# Patient Record
Sex: Male | Born: 1970
Health system: Southern US, Community
[De-identification: ages and names within clinical notes are randomized; demographics above are authoritative.]

## PROBLEM LIST (undated history)

## (undated) DIAGNOSIS — F419 Anxiety disorder, unspecified: Secondary | ICD-10-CM

## (undated) DIAGNOSIS — F32A Depression, unspecified: Secondary | ICD-10-CM

## (undated) DIAGNOSIS — J449 Chronic obstructive pulmonary disease, unspecified: Secondary | ICD-10-CM

## (undated) DIAGNOSIS — F329 Major depressive disorder, single episode, unspecified: Secondary | ICD-10-CM

## (undated) DIAGNOSIS — I10 Essential (primary) hypertension: Secondary | ICD-10-CM

## (undated) DIAGNOSIS — M1990 Unspecified osteoarthritis, unspecified site: Secondary | ICD-10-CM

## (undated) DIAGNOSIS — E7800 Pure hypercholesterolemia, unspecified: Secondary | ICD-10-CM

## (undated) HISTORY — DX: Unspecified osteoarthritis, unspecified site: M19.90

## (undated) HISTORY — PX: LITHOTRIPSY: SUR834

## (undated) HISTORY — DX: Chronic obstructive pulmonary disease, unspecified: J44.9

## (undated) HISTORY — PX: BACK SURGERY: SHX140

---

## 2014-02-06 ENCOUNTER — Emergency Department (HOSPITAL_COMMUNITY)
Admission: EM | Admit: 2014-02-06 | Discharge: 2014-02-06 | Disposition: A | Discharge status: Home / Self Care | Payer: Medicare Other | Attending: Emergency Medicine | Admitting: Emergency Medicine

## 2014-02-06 ENCOUNTER — Encounter (HOSPITAL_COMMUNITY): Payer: Self-pay | Admitting: *Deleted

## 2014-02-06 DIAGNOSIS — Z720 Tobacco use: Secondary | ICD-10-CM | POA: Insufficient documentation

## 2014-02-06 DIAGNOSIS — M549 Dorsalgia, unspecified: Secondary | ICD-10-CM | POA: Diagnosis present

## 2014-02-06 DIAGNOSIS — M5441 Lumbago with sciatica, right side: Secondary | ICD-10-CM | POA: Insufficient documentation

## 2014-02-06 MED ORDER — DIAZEPAM 5 MG/ML IJ SOLN: 5.0000 mg | Freq: Once | INTRAMUSCULAR | Status: DC

## 2014-02-06 MED ORDER — HYDROCODONE-ACETAMINOPHEN 5-325 MG PO TABS
1.0000 | ORAL_TABLET | Freq: Once | ORAL | Status: AC
  Administered 2014-02-06: 1 via ORAL
  Filled 2014-02-06: qty 1

## 2014-02-06 MED ORDER — KETOROLAC TROMETHAMINE 60 MG/2ML IM SOLN
30.0000 mg | Freq: Once | INTRAMUSCULAR | Status: AC
  Administered 2014-02-06: 30 mg via INTRAMUSCULAR
  Filled 2014-02-06: qty 2

## 2014-02-06 MED ORDER — DIAZEPAM 5 MG PO TABS: 5.0000 mg | ORAL_TABLET | Freq: Once | ORAL | Status: DC

## 2014-02-06 MED ORDER — IBUPROFEN 800 MG PO TABS: 800.0000 mg | ORAL_TABLET | Freq: Three times a day (TID) | ORAL | Status: AC

## 2014-02-06 MED ORDER — DIAZEPAM 5 MG PO TABS
5.0000 mg | ORAL_TABLET | Freq: Once | ORAL | Status: AC
  Administered 2014-02-06: 5 mg via ORAL
  Filled 2014-02-06: qty 1

## 2014-02-06 MED ORDER — HYDROCODONE-ACETAMINOPHEN 5-325 MG PO TABS: 1.0000 | ORAL_TABLET | Freq: Four times a day (QID) | ORAL | Status: DC | PRN

## 2014-02-06 NOTE — ED Notes (Signed)
Pt has past hx of back surgery and injury. Pt had cabinets fall on him 2 days ago and states he has had increase pain since then. Pt states his pain moves from his lower back to his upper back. NAD noted at this time.

## 2014-02-06 NOTE — ED Provider Notes (Signed)
CSN: [id number]     Arrival date & time 02/06/14  1401 History   This chart was scribe for Carmin Muskrat, MD by Judithann Sauger, ED Scribe. The patient was seen in room APA09/APA09 and the patient's care was started at 6:56 PM.    Chief Complaint  Patient presents with  . Back Pain    The history is provided by the patient. No language interpreter was used.   HPI Comments: Exodus Kutzer is a 43 y.o. male with chronic back pain who presents to the Emergency Department complaining of back pain which has gotten worse when a cabinet fell on him about 4 days ago.  He reports associated pain to the entirety of the lumbar spine. He is able to walk with a cane. He denies any decreased sensation to his legs, abdominal pain, fever, and  confusion. He denies taking any medication at home but has been icing his back. He reports that he smokes. He reports an allergy to Sulfa Antibiotics.  He reports that he is trying to see Dr. Brigitte Pulse in January when his insurance changes.  History reviewed. No pertinent past medical history. Past Surgical History  Procedure Laterality Date  . Back surgery     No family history on file. History  Substance Use Topics  . Smoking status: Current Every Day Smoker  . Smokeless tobacco: Not on file  . Alcohol Use: Yes     Comment: occ.    Review of Systems  Constitutional: Negative for fever.       Per HPI, otherwise negative  HENT:       Per HPI, otherwise negative  Respiratory:       Per HPI, otherwise negative  Cardiovascular:       Per HPI, otherwise negative  Gastrointestinal: Negative for vomiting and abdominal pain.  Endocrine:       Negative aside from HPI  Genitourinary:       Neg aside from HPI   Musculoskeletal: Positive for back pain.       Per HPI, otherwise negative  Skin: Negative.   Neurological: Negative for syncope and weakness.      Allergies  Sulfa antibiotics  Home Medications   Prior to Admission medications   Not on  File   BP 153/106 mmHg  Pulse 81  Temp(Src) 98.1 F (36.7 C) (Oral)  Resp 18  Ht 5\' 3"  (1.6 m)  Wt 210 lb (95.255 kg)  BMI 37.21 kg/m2  SpO2 100% Physical Exam  Constitutional: He is oriented to person, place, and time. He appears well-developed. No distress.  HENT:  Head: Normocephalic and atraumatic.  Eyes: Conjunctivae and EOM are normal.  Cardiovascular: Normal rate, regular rhythm and normal heart sounds.   Pulmonary/Chest: Effort normal and breath sounds normal. No stridor. No respiratory distress.  Abdominal: He exhibits no distension.  Musculoskeletal: He exhibits no edema.  Appropriate strength in both extremities  Neurological: He is alert and oriented to person, place, and time.  Skin: Skin is warm and dry.  Psychiatric: He has a normal mood and affect.  Nursing note and vitals reviewed.   ED Course  Procedures (including critical care time) DIAGNOSTIC STUDIES: Oxygen Saturation is 100% on RA, normal by my interpretation.    COORDINATION OF CARE: 7:05 PM- Pt advised of plan for treatment and pt agrees.      MDM   Final diagnoses:  Bilateral low back pain with right-sided sciatica   patient present with severe low back pain.  No  evidence for neurologic dysfunction, nor ReFlex for infectious processes. Patient was started on a course of analgesics, muscle relaxants, with follow-up with primary care and neurosurgery.   I personally performed the services described in this documentation, which was scribed in my presence. The recorded information has been reviewed and is accurate.    Carmin Muskrat, MD 02/06/14 [phone]

## 2014-02-06 NOTE — Discharge Instructions (Signed)
As discussed, your evaluation today has been largely reassuring.  But, it is important that you monitor your condition carefully, and do not hesitate to return to the ED if you develop new, or concerning changes in your condition.  Please take all medications as directed.  Please follow-up with your physicians for appropriate ongoing care.   Back Pain, Adult Back pain is very common. The pain often gets better over time. The cause of back pain is usually not dangerous. Most people can learn to manage their back pain on their own.  HOME CARE   Stay active. Start with short walks on flat ground if you can. Try to walk farther each day.  Do not sit, drive, or stand in one place for more than 30 minutes. Do not stay in bed.  Do not avoid exercise or work. Activity can help your back heal faster.  Be careful when you bend or lift an object. Bend at your knees, keep the object close to you, and do not twist.  Sleep on a firm mattress. Lie on your side, and bend your knees. If you lie on your back, put a pillow under your knees.  Only take medicines as told by your doctor.  Put ice on the injured area.  Put ice in a plastic bag.  Place a towel between your skin and the bag.  Leave the ice on for 15-20 minutes, 03-04 times a day for the first 2 to 3 days. After that, you can switch between ice and heat packs.  Ask your doctor about back exercises or massage.  Avoid feeling anxious or stressed. Find good ways to deal with stress, such as exercise. GET HELP RIGHT AWAY IF:   Your pain does not go away with rest or medicine.  Your pain does not go away in 1 week.  You have new problems.  You do not feel well.  The pain spreads into your legs.  You cannot control when you poop (bowel movement) or pee (urinate).  Your arms or legs feel weak or lose feeling (numbness).  You feel sick to your stomach (nauseous) or throw up (vomit).  You have belly (abdominal) pain.  You feel  like you may pass out (faint). MAKE SURE YOU:   Understand these instructions.  Will watch your condition.  Will get help right away if you are not doing well or get worse. Document Released: 08/13/2007 Document Revised: 05/19/2011 Document Reviewed: 06/28/2013 [facility] Patient Information 2015 [place], [place]. This information is not intended to replace advice given to you by your health care provider. Make sure you discuss any questions you have with your health care provider.

## 2014-07-12 ENCOUNTER — Other Ambulatory Visit (HOSPITAL_COMMUNITY): Payer: Self-pay | Admitting: Neurosurgery

## 2014-07-12 DIAGNOSIS — M545 Low back pain: Secondary | ICD-10-CM

## 2014-07-19 ENCOUNTER — Ambulatory Visit (HOSPITAL_COMMUNITY)
Admission: RE | Admit: 2014-07-19 | Discharge: 2014-07-19 | Disposition: A | Discharge status: Home / Self Care | Payer: Medicare Other | Source: Ambulatory Visit | Attending: Neurosurgery | Admitting: Neurosurgery

## 2014-07-19 DIAGNOSIS — Z9889 Other specified postprocedural states: Secondary | ICD-10-CM | POA: Insufficient documentation

## 2014-07-19 DIAGNOSIS — G8929 Other chronic pain: Secondary | ICD-10-CM | POA: Insufficient documentation

## 2014-07-19 DIAGNOSIS — M79604 Pain in right leg: Secondary | ICD-10-CM | POA: Insufficient documentation

## 2014-07-19 DIAGNOSIS — M545 Low back pain: Secondary | ICD-10-CM | POA: Diagnosis present

## 2014-07-19 DIAGNOSIS — M5126 Other intervertebral disc displacement, lumbar region: Secondary | ICD-10-CM | POA: Diagnosis not present

## 2014-07-19 LAB — POCT I-STAT CREATININE: Creatinine, Ser: 1.3 mg/dL — ABNORMAL HIGH (ref 0.61–1.24)

## 2014-07-19 MED ORDER — GADOBENATE DIMEGLUMINE 529 MG/ML IV SOLN
17.0000 mL | Freq: Once | INTRAVENOUS | Status: AC | PRN
  Administered 2014-07-19: 17 mL via INTRAVENOUS

## 2015-01-28 ENCOUNTER — Emergency Department (HOSPITAL_COMMUNITY)
Admission: EM | Admit: 2015-01-28 | Discharge: 2015-01-28 | Disposition: A | Discharge status: Home / Self Care | Payer: Medicare Other | Attending: Emergency Medicine | Admitting: Emergency Medicine

## 2015-01-28 ENCOUNTER — Emergency Department (HOSPITAL_COMMUNITY): Payer: Medicare Other

## 2015-01-28 ENCOUNTER — Encounter (HOSPITAL_COMMUNITY): Payer: Self-pay | Admitting: Emergency Medicine

## 2015-01-28 DIAGNOSIS — F17200 Nicotine dependence, unspecified, uncomplicated: Secondary | ICD-10-CM | POA: Insufficient documentation

## 2015-01-28 DIAGNOSIS — Y998 Other external cause status: Secondary | ICD-10-CM | POA: Diagnosis not present

## 2015-01-28 DIAGNOSIS — Y92410 Unspecified street and highway as the place of occurrence of the external cause: Secondary | ICD-10-CM | POA: Insufficient documentation

## 2015-01-28 DIAGNOSIS — S3992XA Unspecified injury of lower back, initial encounter: Secondary | ICD-10-CM | POA: Diagnosis present

## 2015-01-28 DIAGNOSIS — Y9389 Activity, other specified: Secondary | ICD-10-CM | POA: Diagnosis not present

## 2015-01-28 DIAGNOSIS — Z7982 Long term (current) use of aspirin: Secondary | ICD-10-CM | POA: Diagnosis not present

## 2015-01-28 DIAGNOSIS — Z79899 Other long term (current) drug therapy: Secondary | ICD-10-CM | POA: Insufficient documentation

## 2015-01-28 DIAGNOSIS — S39012A Strain of muscle, fascia and tendon of lower back, initial encounter: Secondary | ICD-10-CM | POA: Diagnosis not present

## 2015-01-28 MED ORDER — OXYCODONE-ACETAMINOPHEN 5-325 MG PO TABS: 1.0000 | ORAL_TABLET | Freq: Four times a day (QID) | ORAL | Status: DC | PRN

## 2015-01-28 MED ORDER — ONDANSETRON HCL 4 MG/2ML IJ SOLN
4.0000 mg | Freq: Once | INTRAMUSCULAR | Status: AC
  Administered 2015-01-28: 4 mg via INTRAVENOUS
  Filled 2015-01-28: qty 2

## 2015-01-28 MED ORDER — HYDROMORPHONE HCL 1 MG/ML IJ SOLN
1.0000 mg | Freq: Once | INTRAMUSCULAR | Status: AC
  Administered 2015-01-28: 1 mg via INTRAVENOUS
  Filled 2015-01-28: qty 1

## 2015-01-28 NOTE — Discharge Instructions (Signed)
Follow up with your md this week. °
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
## 2015-02-07 ENCOUNTER — Emergency Department (HOSPITAL_COMMUNITY)
Admission: EM | Admit: 2015-02-07 | Discharge: 2015-02-07 | Disposition: A | Discharge status: Home / Self Care | Payer: Medicare Other | Attending: Emergency Medicine | Admitting: Emergency Medicine

## 2015-02-07 ENCOUNTER — Encounter (HOSPITAL_COMMUNITY): Payer: Self-pay | Admitting: Emergency Medicine

## 2015-02-07 DIAGNOSIS — M545 Low back pain: Secondary | ICD-10-CM | POA: Diagnosis not present

## 2015-02-07 DIAGNOSIS — Z79899 Other long term (current) drug therapy: Secondary | ICD-10-CM | POA: Diagnosis not present

## 2015-02-07 DIAGNOSIS — R197 Diarrhea, unspecified: Secondary | ICD-10-CM | POA: Insufficient documentation

## 2015-02-07 DIAGNOSIS — Z79891 Long term (current) use of opiate analgesic: Secondary | ICD-10-CM | POA: Insufficient documentation

## 2015-02-07 DIAGNOSIS — Z7982 Long term (current) use of aspirin: Secondary | ICD-10-CM | POA: Diagnosis not present

## 2015-02-07 DIAGNOSIS — R51 Headache: Secondary | ICD-10-CM | POA: Insufficient documentation

## 2015-02-07 DIAGNOSIS — I10 Essential (primary) hypertension: Secondary | ICD-10-CM | POA: Insufficient documentation

## 2015-02-07 DIAGNOSIS — Z87828 Personal history of other (healed) physical injury and trauma: Secondary | ICD-10-CM | POA: Insufficient documentation

## 2015-02-07 DIAGNOSIS — F17210 Nicotine dependence, cigarettes, uncomplicated: Secondary | ICD-10-CM | POA: Insufficient documentation

## 2015-02-07 DIAGNOSIS — M549 Dorsalgia, unspecified: Secondary | ICD-10-CM | POA: Diagnosis present

## 2015-02-07 HISTORY — DX: Essential (primary) hypertension: I10

## 2015-02-07 MED ORDER — HYDROMORPHONE HCL 2 MG/ML IJ SOLN
2.0000 mg | Freq: Once | INTRAMUSCULAR | Status: AC
  Administered 2015-02-07: 2 mg via INTRAMUSCULAR
  Filled 2015-02-07: qty 1
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
## 2015-02-07 NOTE — Discharge Instructions (Signed)
Continue your current pain medicines also would recommend taking the Valium with it. Make an appointment to follow-up with your primary care doctor. His symptoms persist MRI of back would be appropriate.

## 2015-03-28 ENCOUNTER — Other Ambulatory Visit (HOSPITAL_COMMUNITY): Payer: Self-pay | Admitting: Internal Medicine

## 2015-03-28 DIAGNOSIS — M545 Low back pain: Secondary | ICD-10-CM

## 2015-04-02 DIAGNOSIS — R200 Anesthesia of skin: Secondary | ICD-10-CM | POA: Diagnosis not present

## 2015-04-06 ENCOUNTER — Ambulatory Visit (HOSPITAL_COMMUNITY)
Admission: RE | Admit: 2015-04-06 | Discharge: 2015-04-06 | Disposition: A | Discharge status: Home / Self Care | Payer: Medicare Other | Source: Ambulatory Visit | Attending: Internal Medicine | Admitting: Internal Medicine

## 2015-04-06 DIAGNOSIS — M5127 Other intervertebral disc displacement, lumbosacral region: Secondary | ICD-10-CM | POA: Insufficient documentation

## 2015-04-06 DIAGNOSIS — M5126 Other intervertebral disc displacement, lumbar region: Secondary | ICD-10-CM | POA: Diagnosis not present

## 2015-04-06 DIAGNOSIS — M545 Low back pain: Secondary | ICD-10-CM

## 2015-04-06 DIAGNOSIS — M5137 Other intervertebral disc degeneration, lumbosacral region: Secondary | ICD-10-CM | POA: Diagnosis not present

## 2015-04-06 DIAGNOSIS — M5136 Other intervertebral disc degeneration, lumbar region: Secondary | ICD-10-CM | POA: Diagnosis not present

## 2015-04-06 DIAGNOSIS — M5124 Other intervertebral disc displacement, thoracic region: Secondary | ICD-10-CM | POA: Insufficient documentation

## 2015-04-27 DIAGNOSIS — I10 Essential (primary) hypertension: Secondary | ICD-10-CM | POA: Diagnosis not present

## 2015-04-27 DIAGNOSIS — Z0000 Encounter for general adult medical examination without abnormal findings: Secondary | ICD-10-CM | POA: Diagnosis not present

## 2015-04-27 DIAGNOSIS — R7301 Impaired fasting glucose: Secondary | ICD-10-CM | POA: Diagnosis not present

## 2015-05-01 DIAGNOSIS — M545 Low back pain: Secondary | ICD-10-CM | POA: Diagnosis not present

## 2015-05-01 DIAGNOSIS — E782 Mixed hyperlipidemia: Secondary | ICD-10-CM | POA: Diagnosis not present

## 2015-05-01 DIAGNOSIS — I10 Essential (primary) hypertension: Secondary | ICD-10-CM | POA: Diagnosis not present

## 2015-05-22 DIAGNOSIS — J00 Acute nasopharyngitis [common cold]: Secondary | ICD-10-CM | POA: Diagnosis not present

## 2015-05-22 DIAGNOSIS — F17200 Nicotine dependence, unspecified, uncomplicated: Secondary | ICD-10-CM | POA: Diagnosis not present

## 2015-06-11 DIAGNOSIS — I10 Essential (primary) hypertension: Secondary | ICD-10-CM | POA: Diagnosis not present

## 2015-06-11 DIAGNOSIS — E782 Mixed hyperlipidemia: Secondary | ICD-10-CM | POA: Diagnosis not present

## 2015-06-26 DIAGNOSIS — J441 Chronic obstructive pulmonary disease with (acute) exacerbation: Secondary | ICD-10-CM | POA: Diagnosis not present

## 2015-07-27 DIAGNOSIS — B079 Viral wart, unspecified: Secondary | ICD-10-CM | POA: Diagnosis not present

## 2015-09-14 DIAGNOSIS — E782 Mixed hyperlipidemia: Secondary | ICD-10-CM | POA: Diagnosis not present

## 2015-09-18 DIAGNOSIS — E782 Mixed hyperlipidemia: Secondary | ICD-10-CM | POA: Diagnosis not present

## 2015-09-18 DIAGNOSIS — J441 Chronic obstructive pulmonary disease with (acute) exacerbation: Secondary | ICD-10-CM | POA: Diagnosis not present

## 2015-09-18 DIAGNOSIS — I10 Essential (primary) hypertension: Secondary | ICD-10-CM | POA: Diagnosis not present

## 2015-10-27 ENCOUNTER — Other Ambulatory Visit (HOSPITAL_COMMUNITY): Payer: Self-pay | Admitting: Nurse Practitioner

## 2015-10-27 ENCOUNTER — Ambulatory Visit (HOSPITAL_COMMUNITY)
Admission: RE | Admit: 2015-10-27 | Discharge: 2015-10-27 | Disposition: A | Discharge status: Home / Self Care | Payer: Medicare Other | Source: Ambulatory Visit | Attending: Nurse Practitioner | Admitting: Nurse Practitioner

## 2015-10-27 DIAGNOSIS — R109 Unspecified abdominal pain: Secondary | ICD-10-CM

## 2015-10-27 DIAGNOSIS — R1030 Lower abdominal pain, unspecified: Secondary | ICD-10-CM | POA: Insufficient documentation

## 2015-10-27 DIAGNOSIS — R1011 Right upper quadrant pain: Secondary | ICD-10-CM | POA: Diagnosis not present

## 2016-01-23 DIAGNOSIS — S51801A Unspecified open wound of right forearm, initial encounter: Secondary | ICD-10-CM | POA: Diagnosis not present

## 2016-02-05 DIAGNOSIS — L821 Other seborrheic keratosis: Secondary | ICD-10-CM | POA: Diagnosis not present

## 2016-02-05 DIAGNOSIS — L308 Other specified dermatitis: Secondary | ICD-10-CM | POA: Diagnosis not present

## 2016-03-05 DIAGNOSIS — Z0000 Encounter for general adult medical examination without abnormal findings: Secondary | ICD-10-CM | POA: Diagnosis not present

## 2016-03-13 DIAGNOSIS — E782 Mixed hyperlipidemia: Secondary | ICD-10-CM | POA: Diagnosis not present

## 2016-03-17 DIAGNOSIS — E782 Mixed hyperlipidemia: Secondary | ICD-10-CM | POA: Diagnosis not present

## 2016-03-17 DIAGNOSIS — E781 Pure hyperglyceridemia: Secondary | ICD-10-CM | POA: Diagnosis not present

## 2016-03-17 DIAGNOSIS — I10 Essential (primary) hypertension: Secondary | ICD-10-CM | POA: Diagnosis not present

## 2016-03-17 DIAGNOSIS — M545 Low back pain: Secondary | ICD-10-CM | POA: Diagnosis not present

## 2016-03-17 DIAGNOSIS — J441 Chronic obstructive pulmonary disease with (acute) exacerbation: Secondary | ICD-10-CM | POA: Diagnosis not present

## 2016-08-09 DIAGNOSIS — G894 Chronic pain syndrome: Secondary | ICD-10-CM | POA: Diagnosis not present

## 2016-08-09 DIAGNOSIS — R825 Elevated urine levels of drugs, medicaments and biological substances: Secondary | ICD-10-CM | POA: Diagnosis not present

## 2016-08-09 DIAGNOSIS — M1990 Unspecified osteoarthritis, unspecified site: Secondary | ICD-10-CM | POA: Diagnosis not present

## 2016-09-02 DIAGNOSIS — Z79891 Long term (current) use of opiate analgesic: Secondary | ICD-10-CM | POA: Diagnosis not present

## 2016-09-02 DIAGNOSIS — M79604 Pain in right leg: Secondary | ICD-10-CM | POA: Diagnosis not present

## 2016-09-02 DIAGNOSIS — M545 Low back pain: Secondary | ICD-10-CM | POA: Diagnosis not present

## 2016-09-02 DIAGNOSIS — M79643 Pain in unspecified hand: Secondary | ICD-10-CM | POA: Diagnosis not present

## 2016-09-02 DIAGNOSIS — G894 Chronic pain syndrome: Secondary | ICD-10-CM | POA: Diagnosis not present

## 2016-09-02 DIAGNOSIS — Z79899 Other long term (current) drug therapy: Secondary | ICD-10-CM | POA: Diagnosis not present

## 2016-09-09 DIAGNOSIS — Z79899 Other long term (current) drug therapy: Secondary | ICD-10-CM | POA: Diagnosis not present

## 2016-09-09 DIAGNOSIS — Z79891 Long term (current) use of opiate analgesic: Secondary | ICD-10-CM | POA: Diagnosis not present

## 2016-09-09 DIAGNOSIS — M792 Neuralgia and neuritis, unspecified: Secondary | ICD-10-CM | POA: Diagnosis not present

## 2016-09-09 DIAGNOSIS — G894 Chronic pain syndrome: Secondary | ICD-10-CM | POA: Diagnosis not present

## 2016-09-16 DIAGNOSIS — G894 Chronic pain syndrome: Secondary | ICD-10-CM | POA: Diagnosis not present

## 2016-09-16 DIAGNOSIS — M545 Low back pain: Secondary | ICD-10-CM | POA: Diagnosis not present

## 2016-09-16 DIAGNOSIS — Z79899 Other long term (current) drug therapy: Secondary | ICD-10-CM | POA: Diagnosis not present

## 2016-09-16 DIAGNOSIS — M79604 Pain in right leg: Secondary | ICD-10-CM | POA: Diagnosis not present

## 2016-09-16 DIAGNOSIS — Z79891 Long term (current) use of opiate analgesic: Secondary | ICD-10-CM | POA: Diagnosis not present

## 2016-09-16 DIAGNOSIS — M79643 Pain in unspecified hand: Secondary | ICD-10-CM | POA: Diagnosis not present

## 2016-09-25 DIAGNOSIS — I10 Essential (primary) hypertension: Secondary | ICD-10-CM | POA: Diagnosis not present

## 2016-09-25 DIAGNOSIS — E782 Mixed hyperlipidemia: Secondary | ICD-10-CM | POA: Diagnosis not present

## 2016-10-06 DIAGNOSIS — E781 Pure hyperglyceridemia: Secondary | ICD-10-CM | POA: Diagnosis not present

## 2016-10-06 DIAGNOSIS — G894 Chronic pain syndrome: Secondary | ICD-10-CM | POA: Diagnosis not present

## 2016-10-06 DIAGNOSIS — J441 Chronic obstructive pulmonary disease with (acute) exacerbation: Secondary | ICD-10-CM | POA: Diagnosis not present

## 2016-10-06 DIAGNOSIS — E782 Mixed hyperlipidemia: Secondary | ICD-10-CM | POA: Diagnosis not present

## 2016-10-06 DIAGNOSIS — Z0289 Encounter for other administrative examinations: Secondary | ICD-10-CM | POA: Diagnosis not present

## 2016-10-06 DIAGNOSIS — I10 Essential (primary) hypertension: Secondary | ICD-10-CM | POA: Diagnosis not present

## 2016-10-22 DIAGNOSIS — Z79899 Other long term (current) drug therapy: Secondary | ICD-10-CM | POA: Diagnosis not present

## 2016-10-22 DIAGNOSIS — M5440 Lumbago with sciatica, unspecified side: Secondary | ICD-10-CM | POA: Diagnosis not present

## 2016-10-27 DIAGNOSIS — R0602 Shortness of breath: Secondary | ICD-10-CM | POA: Diagnosis not present

## 2016-10-27 DIAGNOSIS — J449 Chronic obstructive pulmonary disease, unspecified: Secondary | ICD-10-CM | POA: Diagnosis not present

## 2016-10-27 DIAGNOSIS — M5440 Lumbago with sciatica, unspecified side: Secondary | ICD-10-CM | POA: Diagnosis not present

## 2016-10-27 DIAGNOSIS — J22 Unspecified acute lower respiratory infection: Secondary | ICD-10-CM | POA: Diagnosis not present

## 2016-11-04 DIAGNOSIS — M545 Low back pain: Secondary | ICD-10-CM | POA: Diagnosis not present

## 2016-11-04 DIAGNOSIS — J069 Acute upper respiratory infection, unspecified: Secondary | ICD-10-CM | POA: Diagnosis not present

## 2016-11-08 ENCOUNTER — Emergency Department (HOSPITAL_COMMUNITY)
Admission: EM | Admit: 2016-11-08 | Discharge: 2016-11-08 | Disposition: A | Discharge status: Home / Self Care | Payer: Medicare Other | Attending: Emergency Medicine | Admitting: Emergency Medicine

## 2016-11-08 ENCOUNTER — Encounter (HOSPITAL_COMMUNITY): Payer: Self-pay | Admitting: Emergency Medicine

## 2016-11-08 ENCOUNTER — Emergency Department (HOSPITAL_COMMUNITY): Payer: Medicare Other

## 2016-11-08 DIAGNOSIS — I10 Essential (primary) hypertension: Secondary | ICD-10-CM | POA: Insufficient documentation

## 2016-11-08 DIAGNOSIS — Y929 Unspecified place or not applicable: Secondary | ICD-10-CM | POA: Insufficient documentation

## 2016-11-08 DIAGNOSIS — W260XXA Contact with knife, initial encounter: Secondary | ICD-10-CM | POA: Diagnosis not present

## 2016-11-08 DIAGNOSIS — Y9389 Activity, other specified: Secondary | ICD-10-CM | POA: Insufficient documentation

## 2016-11-08 DIAGNOSIS — Y999 Unspecified external cause status: Secondary | ICD-10-CM | POA: Insufficient documentation

## 2016-11-08 DIAGNOSIS — F17210 Nicotine dependence, cigarettes, uncomplicated: Secondary | ICD-10-CM | POA: Insufficient documentation

## 2016-11-08 DIAGNOSIS — Z7982 Long term (current) use of aspirin: Secondary | ICD-10-CM | POA: Diagnosis not present

## 2016-11-08 DIAGNOSIS — S61012A Laceration without foreign body of left thumb without damage to nail, initial encounter: Secondary | ICD-10-CM | POA: Diagnosis not present

## 2016-11-08 DIAGNOSIS — S6992XA Unspecified injury of left wrist, hand and finger(s), initial encounter: Secondary | ICD-10-CM | POA: Diagnosis not present

## 2016-11-08 DIAGNOSIS — Z79899 Other long term (current) drug therapy: Secondary | ICD-10-CM | POA: Diagnosis not present

## 2016-11-08 HISTORY — DX: Pure hypercholesterolemia, unspecified: E78.00

## 2016-11-08 HISTORY — DX: Anxiety disorder, unspecified: F41.9

## 2016-11-08 HISTORY — DX: Depression, unspecified: F32.A

## 2016-11-08 HISTORY — DX: Major depressive disorder, single episode, unspecified: F32.9

## 2016-11-08 MED ORDER — OXYCODONE-ACETAMINOPHEN 5-325 MG PO TABS
1.0000 | ORAL_TABLET | Freq: Once | ORAL | Status: AC
  Administered 2016-11-08: 1 via ORAL
  Filled 2016-11-08: qty 1

## 2016-11-08 MED ORDER — POVIDONE-IODINE 10 % EX SOLN
CUTANEOUS | Status: DC
Start: 2016-11-08 — End: 2016-11-09
  Filled 2016-11-08: qty 30

## 2016-11-08 MED ORDER — LIDOCAINE HCL (PF) 1 % IJ SOLN
INTRAMUSCULAR | Status: AC
  Filled 2016-11-08: qty 5

## 2016-11-08 NOTE — ED Triage Notes (Addendum)
Cut left thumb on on knife while cutting fishing line.  No active bleeding

## 2016-11-08 NOTE — Discharge Instructions (Signed)
Clean laceration twice a day with soap and water or peroxide and then apply dressing. Follow-up with their family doctor to have the sutures taken out in 10-14 days. Get seen sooner if any problems

## 2016-11-08 NOTE — ED Notes (Signed)
Patient returned from X-ray 

## 2016-11-08 NOTE — ED Notes (Signed)
ED Provider at bedside for suture repair 

## 2016-11-08 NOTE — ED Notes (Signed)
Patient transported to X-ray 

## 2016-11-08 NOTE — ED Provider Notes (Signed)
[facility] DEPT Provider Note   CSN: [id number] Arrival date & time: 11/08/16  2005     History   Chief Complaint Chief Complaint  Patient presents with  . Laceration    HPI Alan Miller is a 46 y.o. male.  Patient accidentally cut his left thumb with a knife when he was working on his fishing poles   The history is provided by the patient. No language interpreter was used.  Laceration   The incident occurred 1 to 2 hours ago. Pain location: left thumb. The laceration is 2 cm in size. The laceration mechanism was a a clean knife. The pain is at a severity of 2/10. The pain is moderate. The pain has been constant since onset. He reports no foreign bodies present. His tetanus status is UTD.    Past Medical History:  Diagnosis Date  . Anxiety   . Depression   . Hypercholesteremia   . Hypertension     There are no active problems to display for this patient.   Past Surgical History:  Procedure Laterality Date  . BACK SURGERY    . LITHOTRIPSY         Home Medications    Prior to Admission medications   Medication Sig Start Date End Date Taking? Authorizing Provider  ALPRAZolam Duanne Moron) 0.5 MG tablet Take 0.5 mg by mouth 2 (two) times daily. 01/24/15   [provider]  amLODipine (NORVASC) 5 MG tablet Take 5 mg by mouth at bedtime. 01/24/15   [provider]  aspirin EC 81 MG tablet Take 81 mg by mouth daily.    [provider]  Aspirin-Acetaminophen (GOODY BODY PAIN) 500-325 MG PACK Take 1 packet by mouth every 6 (six) hours as needed (Pain).    [provider]  buPROPion (WELLBUTRIN XL) 300 MG 24 hr tablet Take 300 mg by mouth daily. 01/24/15   [provider]  diazepam (VALIUM) 5 MG tablet Take 1 tablet (5 mg total) by mouth once. Patient taking differently: Take 5 mg by mouth 2 (two) times daily.  02/06/14   Carmin Muskrat, MD  EDARBI 80 MG TABS Take 80 mg by mouth daily. 01/24/15   [provider]    gabapentin (NEURONTIN) 600 MG tablet Take 600 mg by mouth 3 (three) times daily. 01/24/15   [provider]  HYDROcodone-acetaminophen (NORCO/VICODIN) 5-325 MG per tablet Take 1 tablet by mouth every 6 (six) hours as needed for moderate pain. 02/06/14   Carmin Muskrat, MD  naphazoline-glycerin (CLEAR EYES) 0.012-0.2 % SOLN Place 1-2 drops into both eyes every 4 (four) hours as needed for irritation.    [provider]  oxyCODONE-acetaminophen (PERCOCET/ROXICET) 5-325 MG tablet Take 1 tablet by mouth every 6 (six) hours as needed. 01/28/15   Milton Ferguson, MD  PROAIR HFA 108 (90 BASE) MCG/ACT inhaler Inhale 2 puffs into the lungs every 4 (four) hours as needed. 12/15/14   [provider]  traMADol (ULTRAM) 50 MG tablet Take 100 mg by mouth 3 (three) times daily. 01/24/15   [provider]    Family History No family history on file.  Social History Social History  Substance Use Topics  . Smoking status: Current Every Day Smoker    Packs/day: 1.50    Types: Cigarettes  . Smokeless tobacco: Not on file  . Alcohol use Yes     Comment: occ.     Allergies   Sulfa antibiotics   Review of Systems Review of Systems  Constitutional: Negative for  appetite change and fatigue.  HENT: Negative for congestion, ear discharge and sinus pressure.   Eyes: Negative for discharge.  Respiratory: Negative for cough.   Cardiovascular: Negative for chest pain.  Gastrointestinal: Negative for abdominal pain and diarrhea.  Genitourinary: Negative for frequency and hematuria.  Musculoskeletal: Negative for back pain.       Pain in left thumb  Skin: Negative for rash.  Neurological: Negative for seizures and headaches.  Psychiatric/Behavioral: Negative for hallucinations.     Physical Exam Updated Vital Signs BP 123/80 (BP Location: Right Arm)   Pulse 75   Temp (!) 97.3 F (36.3 C) (Temporal)   Resp 20   Ht 5\' 3"  (1.6 m)   Wt 100.2 kg (221 lb)   SpO2  98%   BMI 39.15 kg/m   Physical Exam  Constitutional: He is oriented to person, place, and time. He appears well-developed.  HENT:  Head: Normocephalic.  Eyes: Conjunctivae are normal.  Neck: No tracheal deviation present.  Cardiovascular:  No murmur heard. Musculoskeletal: Normal range of motion.  Patient has 2 lacerations to his left thumb. He has a 1 cm laceration laterally and also 1 cm laceration medially. Neurovascular exams normal  Neurological: He is oriented to person, place, and time.  Skin: Skin is warm.  Psychiatric: He has a normal mood and affect.     ED Treatments / Results  Labs (all labs ordered are listed, but only abnormal results are displayed) Labs Reviewed - No data to display  EKG  EKG Interpretation None       Radiology Dg Finger Thumb Left  Result Date: 11/08/2016 CLINICAL DATA:  Patient cut left thumb on a knife while cutting fishing line. No active bleeding. EXAM: LEFT THUMB 2+V COMPARISON:  None. FINDINGS: There is no evidence of acute fracture or dislocation. Minimal surface debris seen at the tip of the thumb. Laceration is not apparent radiographically possibly due to its size. No appreciable subcutaneous emphysema. Joint spaces are maintained. There is no evidence of arthropathy or other focal bone abnormality. IMPRESSION: Minimal surface debris at the tip of the thumb. No underlying acute osseous abnormality or radiopaque foreign body noted. Electronically Signed   By: Ashley Royalty M.D.   On: 11/08/2016 21:06    Procedures .Nerve Block Date/Time: 11/08/2016 9:18 PM Performed by: Milton Ferguson Authorized by: Milton Ferguson   Comments:     Patient had digital block done to his left thumb. Area was cleaned thoroughly with Betadine. 1% lidocaine was injected bilaterally at the base of his thumb. Approximately 1.5 mL injected into both sides with thumb.  The patient tolerated the procedure well .Marland KitchenLaceration Repair Date/Time: 11/08/2016 9:19  PM Performed by: Milton Ferguson Authorized by: Milton Ferguson   Comments:     Patient had 2 lacerations to his left thumb both 1 cm. He was anesthetized with a digital block with 1% lidocaine without epi. 2 sutures that were 4-0 nylon were used to close each of the lacerations So he had a total of 4 sutures used to close his 2 cuts. Patient tolerated the procedure well   (including critical care time)  Medications Ordered in ED Medications  povidone-iodine (BETADINE) 10 % external solution (not administered)  lidocaine (PF) (XYLOCAINE) 1 % injection (not administered)  oxyCODONE-acetaminophen (PERCOCET/ROXICET) 5-325 MG per tablet 1 tablet (not administered)     Initial Impression / Assessment and Plan / ED Course  I have reviewed the triage vital signs and the nursing notes.  Pertinent labs &  imaging results that were available during my care of the patient were reviewed by me and considered in my medical decision making (see chart for details).     2 lacerations to the left thumb. Both lacerations 2 cm. Patient had 4 sutures used to close the lacerations and he will follow-up with his PCP in 10-14 days to have the sutures removed. Patient presently started on Cipro yesterday and he will continue taking that antibiotic  Final Clinical Impressions(s) / ED Diagnoses   Final diagnoses:  Laceration of left thumb without foreign body without damage to nail, initial encounter    New Prescriptions New Prescriptions   No medications on file     Milton Ferguson, MD 11/08/16 2120

## 2016-11-11 ENCOUNTER — Other Ambulatory Visit: Payer: Self-pay

## 2016-11-11 NOTE — Patient Outreach (Signed)
Patient returned my call from earlier this morning.  Patient verified PCP and stated that he was going to schedule a follow up appointment with him.  Patient did not call Doctor prior to going to ED.  Patient does not have issues getting to and from Doctors to appointments.  I explained [facility] services and 24 hour Nurse Advice Line to patient.  I told patient I would be mailing him [facility] information and he was fine with that.  I asked if he would like a follow up call from my team member if so I had a few questions.  He said no he was fine and would be looking for information in the mail and would call us if he needed [place] in the future.

## 2016-11-11 NOTE — Patient Outreach (Signed)
Outreach patient after ED visit on 11/08/16.  Unable to reach patient but left a voicemail asking for a return call.  Also mailed patient unsuccessful letter on today.

## 2016-11-15 DIAGNOSIS — I10 Essential (primary) hypertension: Secondary | ICD-10-CM | POA: Diagnosis not present

## 2016-11-15 DIAGNOSIS — Z79899 Other long term (current) drug therapy: Secondary | ICD-10-CM | POA: Diagnosis not present

## 2016-11-15 DIAGNOSIS — J069 Acute upper respiratory infection, unspecified: Secondary | ICD-10-CM | POA: Diagnosis not present

## 2016-11-15 DIAGNOSIS — J302 Other seasonal allergic rhinitis: Secondary | ICD-10-CM | POA: Diagnosis not present

## 2016-12-15 DIAGNOSIS — Z79899 Other long term (current) drug therapy: Secondary | ICD-10-CM | POA: Diagnosis not present

## 2016-12-15 DIAGNOSIS — M5441 Lumbago with sciatica, right side: Secondary | ICD-10-CM | POA: Diagnosis not present

## 2016-12-15 DIAGNOSIS — G8929 Other chronic pain: Secondary | ICD-10-CM | POA: Diagnosis not present

## 2016-12-15 DIAGNOSIS — Z87891 Personal history of nicotine dependence: Secondary | ICD-10-CM | POA: Diagnosis not present

## 2016-12-16 DIAGNOSIS — J309 Allergic rhinitis, unspecified: Secondary | ICD-10-CM | POA: Diagnosis not present

## 2016-12-16 DIAGNOSIS — H9209 Otalgia, unspecified ear: Secondary | ICD-10-CM | POA: Diagnosis not present

## 2016-12-22 DIAGNOSIS — Z79899 Other long term (current) drug therapy: Secondary | ICD-10-CM | POA: Diagnosis not present

## 2016-12-22 DIAGNOSIS — M5441 Lumbago with sciatica, right side: Secondary | ICD-10-CM | POA: Diagnosis not present

## 2016-12-22 DIAGNOSIS — G8929 Other chronic pain: Secondary | ICD-10-CM | POA: Diagnosis not present

## 2017-01-05 DIAGNOSIS — Z79899 Other long term (current) drug therapy: Secondary | ICD-10-CM | POA: Diagnosis not present

## 2017-01-05 DIAGNOSIS — M5441 Lumbago with sciatica, right side: Secondary | ICD-10-CM | POA: Diagnosis not present

## 2017-01-05 DIAGNOSIS — G894 Chronic pain syndrome: Secondary | ICD-10-CM | POA: Diagnosis not present

## 2017-01-06 DIAGNOSIS — J309 Allergic rhinitis, unspecified: Secondary | ICD-10-CM | POA: Diagnosis not present

## 2017-01-08 DIAGNOSIS — M5441 Lumbago with sciatica, right side: Secondary | ICD-10-CM | POA: Diagnosis not present

## 2017-01-08 DIAGNOSIS — G8929 Other chronic pain: Secondary | ICD-10-CM | POA: Diagnosis not present

## 2017-01-08 DIAGNOSIS — M545 Low back pain: Secondary | ICD-10-CM | POA: Diagnosis not present

## 2017-01-08 DIAGNOSIS — M5136 Other intervertebral disc degeneration, lumbar region: Secondary | ICD-10-CM | POA: Diagnosis not present

## 2017-01-08 DIAGNOSIS — M5127 Other intervertebral disc displacement, lumbosacral region: Secondary | ICD-10-CM | POA: Diagnosis not present

## 2017-02-05 DIAGNOSIS — G8929 Other chronic pain: Secondary | ICD-10-CM | POA: Diagnosis not present

## 2017-02-05 DIAGNOSIS — M5441 Lumbago with sciatica, right side: Secondary | ICD-10-CM | POA: Diagnosis not present

## 2017-02-05 DIAGNOSIS — Z79899 Other long term (current) drug therapy: Secondary | ICD-10-CM | POA: Diagnosis not present

## 2017-03-06 DIAGNOSIS — M5441 Lumbago with sciatica, right side: Secondary | ICD-10-CM | POA: Diagnosis not present

## 2017-03-06 DIAGNOSIS — Z79899 Other long term (current) drug therapy: Secondary | ICD-10-CM | POA: Diagnosis not present

## 2017-03-06 DIAGNOSIS — G8929 Other chronic pain: Secondary | ICD-10-CM | POA: Diagnosis not present

## 2017-03-06 DIAGNOSIS — M545 Low back pain: Secondary | ICD-10-CM | POA: Diagnosis not present

## 2017-03-16 DIAGNOSIS — E781 Pure hyperglyceridemia: Secondary | ICD-10-CM | POA: Diagnosis not present

## 2017-03-16 DIAGNOSIS — E782 Mixed hyperlipidemia: Secondary | ICD-10-CM | POA: Diagnosis not present

## 2017-03-16 DIAGNOSIS — I10 Essential (primary) hypertension: Secondary | ICD-10-CM | POA: Diagnosis not present

## 2017-03-16 DIAGNOSIS — D649 Anemia, unspecified: Secondary | ICD-10-CM | POA: Diagnosis not present

## 2017-03-19 DIAGNOSIS — G894 Chronic pain syndrome: Secondary | ICD-10-CM | POA: Diagnosis not present

## 2017-03-19 DIAGNOSIS — E782 Mixed hyperlipidemia: Secondary | ICD-10-CM | POA: Diagnosis not present

## 2017-04-06 DIAGNOSIS — Z79899 Other long term (current) drug therapy: Secondary | ICD-10-CM | POA: Diagnosis not present

## 2017-04-06 DIAGNOSIS — M5441 Lumbago with sciatica, right side: Secondary | ICD-10-CM | POA: Diagnosis not present

## 2017-04-06 DIAGNOSIS — G8929 Other chronic pain: Secondary | ICD-10-CM | POA: Diagnosis not present

## 2017-05-06 DIAGNOSIS — Z79899 Other long term (current) drug therapy: Secondary | ICD-10-CM | POA: Diagnosis not present

## 2017-05-06 DIAGNOSIS — M5440 Lumbago with sciatica, unspecified side: Secondary | ICD-10-CM | POA: Diagnosis not present

## 2017-06-03 DIAGNOSIS — M5441 Lumbago with sciatica, right side: Secondary | ICD-10-CM | POA: Diagnosis not present

## 2017-06-03 DIAGNOSIS — G8929 Other chronic pain: Secondary | ICD-10-CM | POA: Diagnosis not present

## 2017-06-03 DIAGNOSIS — Z79899 Other long term (current) drug therapy: Secondary | ICD-10-CM | POA: Diagnosis not present

## 2017-06-03 DIAGNOSIS — Z87891 Personal history of nicotine dependence: Secondary | ICD-10-CM | POA: Diagnosis not present

## 2017-06-16 DIAGNOSIS — M1990 Unspecified osteoarthritis, unspecified site: Secondary | ICD-10-CM | POA: Diagnosis not present

## 2017-06-16 DIAGNOSIS — D72829 Elevated white blood cell count, unspecified: Secondary | ICD-10-CM | POA: Diagnosis not present

## 2017-06-16 DIAGNOSIS — D649 Anemia, unspecified: Secondary | ICD-10-CM | POA: Diagnosis not present

## 2017-06-16 DIAGNOSIS — E781 Pure hyperglyceridemia: Secondary | ICD-10-CM | POA: Diagnosis not present

## 2017-06-16 DIAGNOSIS — G894 Chronic pain syndrome: Secondary | ICD-10-CM | POA: Diagnosis not present

## 2017-06-19 DIAGNOSIS — M1990 Unspecified osteoarthritis, unspecified site: Secondary | ICD-10-CM | POA: Diagnosis not present

## 2017-06-19 DIAGNOSIS — E782 Mixed hyperlipidemia: Secondary | ICD-10-CM | POA: Diagnosis not present

## 2017-06-19 DIAGNOSIS — I10 Essential (primary) hypertension: Secondary | ICD-10-CM | POA: Diagnosis not present

## 2017-06-19 DIAGNOSIS — M1711 Unilateral primary osteoarthritis, right knee: Secondary | ICD-10-CM | POA: Diagnosis not present

## 2017-06-19 DIAGNOSIS — M545 Low back pain: Secondary | ICD-10-CM | POA: Diagnosis not present

## 2017-06-19 DIAGNOSIS — G894 Chronic pain syndrome: Secondary | ICD-10-CM | POA: Diagnosis not present

## 2017-07-07 DIAGNOSIS — Z1211 Encounter for screening for malignant neoplasm of colon: Secondary | ICD-10-CM | POA: Diagnosis not present

## 2017-07-07 DIAGNOSIS — D649 Anemia, unspecified: Secondary | ICD-10-CM | POA: Diagnosis not present

## 2017-07-07 DIAGNOSIS — Z79899 Other long term (current) drug therapy: Secondary | ICD-10-CM | POA: Diagnosis not present

## 2017-07-07 DIAGNOSIS — M5441 Lumbago with sciatica, right side: Secondary | ICD-10-CM | POA: Diagnosis not present

## 2017-07-07 DIAGNOSIS — G8929 Other chronic pain: Secondary | ICD-10-CM | POA: Diagnosis not present

## 2017-07-17 DIAGNOSIS — D649 Anemia, unspecified: Secondary | ICD-10-CM | POA: Diagnosis not present

## 2017-07-20 DIAGNOSIS — M25519 Pain in unspecified shoulder: Secondary | ICD-10-CM | POA: Diagnosis not present

## 2017-07-20 DIAGNOSIS — D649 Anemia, unspecified: Secondary | ICD-10-CM | POA: Diagnosis not present

## 2017-07-20 DIAGNOSIS — I10 Essential (primary) hypertension: Secondary | ICD-10-CM | POA: Diagnosis not present

## 2017-07-29 ENCOUNTER — Encounter: Payer: Self-pay | Admitting: Orthopaedic Surgery

## 2017-07-29 ENCOUNTER — Ambulatory Visit (INDEPENDENT_AMBULATORY_CARE_PROVIDER_SITE_OTHER): Payer: Medicare Other | Admitting: Orthopaedic Surgery

## 2017-07-29 ENCOUNTER — Ambulatory Visit (INDEPENDENT_AMBULATORY_CARE_PROVIDER_SITE_OTHER): Payer: Medicare Other

## 2017-07-29 VITALS — BP 154/103 | HR 77 | Ht 63.0 in | Wt 222.0 lb

## 2017-07-29 DIAGNOSIS — M25512 Pain in left shoulder: Secondary | ICD-10-CM

## 2017-07-29 DIAGNOSIS — G8929 Other chronic pain: Secondary | ICD-10-CM

## 2017-07-29 NOTE — Progress Notes (Signed)
Subjective: My left shoulder hurts    Patient ID: Alan Miller, male    DOB: 01/07/1971, 47 y.o.   MRN: [id number]  HPI He has had pain in the left shoulder for four to five weeks.  He has no trauma.  He has no weakness or numbness.  He says it just started hurting.  He has tried ice, heat, rubs with no help.  He takes Oxycodone 10 qid for chronic lower back pain.  He says the pain medicine does not help.  He is disabled because of his back.  He has seen Dr. Wende Neighbors who referred the patient here.   Review of Systems  Constitutional: Positive for activity change.  Respiratory: Negative for cough and shortness of breath.   Cardiovascular: Negative for chest pain and leg swelling.  Endocrine: Positive for cold intolerance.  Musculoskeletal: Positive for arthralgias, back pain, gait problem and joint swelling.  Allergic/Immunologic: Positive for environmental allergies.  Psychiatric/Behavioral: The patient is nervous/anxious.    Past Medical History:  Diagnosis Date  . Anxiety   . Depression   . Hypercholesteremia   . Hypertension     Past Surgical History:  Procedure Laterality Date  . BACK SURGERY    . LITHOTRIPSY      Current Outpatient Medications on File Prior to Visit  Medication Sig Dispense Refill  . ALPRAZolam (XANAX) 0.5 MG tablet Take 0.5 mg by mouth 2 (two) times daily.    Marland Kitchen amLODipine (NORVASC) 5 MG tablet Take 5 mg by mouth at bedtime.    Marland Kitchen aspirin EC 81 MG tablet Take 81 mg by mouth daily.    Marland Kitchen BREO ELLIPTA 200-25 MCG/INH AEPB     . buPROPion (WELLBUTRIN XL) 300 MG 24 hr tablet Take 300 mg by mouth daily.    Marland Kitchen EDARBI 80 MG TABS Take 80 mg by mouth daily.    . fenofibrate 160 MG tablet     . gabapentin (NEURONTIN) 600 MG tablet Take 600 mg by mouth 3 (three) times daily.    . Iron-Folic Acid-Vit R15 (IRON FORMULA PO) Take by mouth.    . levocetirizine (XYZAL) 5 MG tablet TAKE 1 TABLET BY MOUTH AT BEDTIME - EMERGENCY REFILL FAXED DR.  0  . meloxicam (MOBIC) 15  MG tablet Take 15 mg by mouth daily.  3  . montelukast (SINGULAIR) 10 MG tablet     . naphazoline-glycerin (CLEAR EYES) 0.012-0.2 % SOLN Place 1-2 drops into both eyes every 4 (four) hours as needed for irritation.    . Omega-3 Fatty Acids (FISH OIL) 1000 MG CAPS Take by mouth.    . oxyCODONE-acetaminophen (PERCOCET/ROXICET) 5-325 MG tablet Take 1 tablet by mouth every 6 (six) hours as needed. 30 tablet 0  . PROAIR HFA 108 (90 BASE) MCG/ACT inhaler Inhale 2 puffs into the lungs every 4 (four) hours as needed.    . sertraline (ZOLOFT) 50 MG tablet Take 50 mg by mouth 2 (two) times daily.  4  . tiZANidine (ZANAFLEX) 4 MG tablet Take 4 mg by mouth every 8 (eight) hours as needed.  2  . Aspirin-Acetaminophen (GOODY BODY PAIN) 500-325 MG PACK Take 1 packet by mouth every 6 (six) hours as needed (Pain).    . traMADol (ULTRAM) 50 MG tablet Take 100 mg by mouth 3 (three) times daily.     No current facility-administered medications on file prior to visit.     Social History   Socioeconomic History  . Marital status: Married  Spouse name: Not on file  . Number of children: Not on file  . Years of education: Not on file  . Highest education level: Not on file  Occupational History  . Not on file  Social Needs  . Financial resource strain: Not on file  . Food insecurity:    Worry: Not on file    Inability: Not on file  . Transportation needs:    Medical: Not on file    Non-medical: Not on file  Tobacco Use  . Smoking status: Current Every Day Smoker    Packs/day: 1.50    Types: Cigarettes  . Smokeless tobacco: Never Used  Substance and Sexual Activity  . Alcohol use: Yes    Comment: occ.  . Drug use: No  . Sexual activity: Not on file  Lifestyle  . Physical activity:    Days per week: Not on file    Minutes per session: Not on file  . Stress: Not on file  Relationships  . Social connections:    Talks on phone: Not on file    Gets together: Not on file    Attends religious  service: Not on file    Active member of club or organization: Not on file    Attends meetings of clubs or organizations: Not on file    Relationship status: Not on file  . Intimate partner violence:    Fear of current or ex partner: Not on file    Emotionally abused: Not on file    Physically abused: Not on file    Forced sexual activity: Not on file  Other Topics Concern  . Not on file  Social History Narrative  . Not on file    History reviewed. No pertinent family history.  BP (!) 154/103   Pulse 77   Ht 5\' 3"  (1.6 m)   Wt 222 lb (100.7 kg)   BMI 39.33 kg/m       Objective:   Physical Exam  Constitutional: He is oriented to person, place, and time. He appears well-developed and well-nourished.  HENT:  Head: Normocephalic and atraumatic.  Eyes: Pupils are equal, round, and reactive to light. Conjunctivae and EOM are normal.  Neck: Normal range of motion. Neck supple.  Cardiovascular: Normal rate, regular rhythm and intact distal pulses.  Pulmonary/Chest: Effort normal.  Abdominal: Soft.  Musculoskeletal:       Left shoulder: He exhibits decreased range of motion, tenderness and pain.       Arms: Neurological: He is alert and oriented to person, place, and time. He has normal reflexes. He displays normal reflexes. No cranial nerve deficit. He exhibits normal muscle tone. Coordination normal.  Skin: Skin is warm and dry.  Psychiatric: He has a normal mood and affect. His behavior is normal. Judgment and thought content normal.     X-rays were done of the left shoulder, reported separately.    Assessment & Plan:   Encounter Diagnosis  Name Primary?  . Chronic left shoulder pain Yes   I am concerned about a rotator cuff tear.  I will get a MRI of the left shoulder.  PROCEDURE NOTE:  The patient request injection, verbal consent was obtained.  The left shoulder was prepped appropriately after time out was performed.   Sterile technique was observed and  injection of 1 cc of Depo-Medrol 40 mg with several cc's of plain xylocaine. Anesthesia was provided by ethyl chloride and a 20-gauge needle was used to inject the shoulder area. A  posterior approach was used.  The injection was tolerated well.  A band aid dressing was applied.  The patient was advised to apply ice later today and tomorrow to the injection sight as needed.  Return after MRI.  Call if any problem.  Precautions discussed.   Electronically Signed Sanjuana Kava, MD 5/22/20199:14 AM

## 2017-07-29 NOTE — Patient Instructions (Signed)
Steps to Quit Smoking Smoking tobacco can be bad for your health. It can also affect almost every organ in your body. Smoking puts you and people around you at risk for many serious long-lasting (chronic) diseases. Quitting smoking is hard, but it is one of the best things that you can do for your health. It is never too late to quit. What are the benefits of quitting smoking? When you quit smoking, you lower your risk for getting serious diseases and conditions. They can include:  Lung cancer or lung disease.  Heart disease.  Stroke.  Heart attack.  Not being able to have children (infertility).  Weak bones (osteoporosis) and broken bones (fractures).  If you have coughing, wheezing, and shortness of breath, those symptoms may get better when you quit. You may also get sick less often. If you are pregnant, quitting smoking can help to lower your chances of having a baby of low birth weight. What can I do to help me quit smoking? Talk with your doctor about what can help you quit smoking. Some things you can do (strategies) include:  Quitting smoking totally, instead of slowly cutting back how much you smoke over a period of time.  Going to in-person counseling. You are more likely to quit if you go to many counseling sessions.  Using resources and support systems, such as: ? Online chats with a counselor. ? Phone quitlines. ? Printed self-help materials. ? Support groups or group counseling. ? Text messaging programs. ? Mobile phone apps or applications.  Taking medicines. Some of these medicines may have nicotine in them. If you are pregnant or breastfeeding, do not take any medicines to quit smoking unless your doctor says it is okay. Talk with your doctor about counseling or other things that can help you.  Talk with your doctor about using more than one strategy at the same time, such as taking medicines while you are also going to in-person counseling. This can help make  quitting easier. What things can I do to make it easier to quit? Quitting smoking might feel very hard at first, but there is a lot that you can do to make it easier. Take these steps:  Talk to your family and friends. Ask them to support and encourage you.  Call phone quitlines, reach out to support groups, or work with a counselor.  Ask people who smoke to not smoke around you.  Avoid places that make you want (trigger) to smoke, such as: ? Bars. ? Parties. ? Smoke-break areas at work.  Spend time with people who do not smoke.  Lower the stress in your life. Stress can make you want to smoke. Try these things to help your stress: ? Getting regular exercise. ? Deep-breathing exercises. ? Yoga. ? Meditating. ? Doing a body scan. To do this, close your eyes, focus on one area of your body at a time from head to toe, and notice which parts of your body are tense. Try to relax the muscles in those areas.  Download or buy apps on your mobile phone or tablet that can help you stick to your quit plan. There are many free apps, such as QuitGuide from the CDC (Centers for Disease Control and Prevention). You can find more support from smokefree.gov and other websites.  This information is not intended to replace advice given to you by your health care provider. Make sure you discuss any questions you have with your health care provider. Document Released: 12/21/2008 Document   Revised: 10/23/2015 Document Reviewed: 07/11/2014 Elsevier Interactive Patient Education  2018 Elsevier Inc.  

## 2017-08-03 DIAGNOSIS — G8929 Other chronic pain: Secondary | ICD-10-CM | POA: Diagnosis not present

## 2017-08-03 DIAGNOSIS — Z79899 Other long term (current) drug therapy: Secondary | ICD-10-CM | POA: Diagnosis not present

## 2017-08-03 DIAGNOSIS — M5441 Lumbago with sciatica, right side: Secondary | ICD-10-CM | POA: Diagnosis not present

## 2017-08-03 DIAGNOSIS — M25512 Pain in left shoulder: Secondary | ICD-10-CM | POA: Diagnosis not present

## 2017-08-05 ENCOUNTER — Ambulatory Visit (HOSPITAL_COMMUNITY)
Admission: RE | Admit: 2017-08-05 | Discharge: 2017-08-05 | Disposition: A | Discharge status: Home / Self Care | Payer: Medicare Other | Source: Ambulatory Visit | Attending: Orthopaedic Surgery | Admitting: Orthopaedic Surgery

## 2017-08-05 DIAGNOSIS — R609 Edema, unspecified: Secondary | ICD-10-CM | POA: Diagnosis not present

## 2017-08-05 DIAGNOSIS — G8929 Other chronic pain: Secondary | ICD-10-CM

## 2017-08-05 DIAGNOSIS — M19012 Primary osteoarthritis, left shoulder: Secondary | ICD-10-CM | POA: Diagnosis not present

## 2017-08-05 DIAGNOSIS — M25512 Pain in left shoulder: Secondary | ICD-10-CM | POA: Insufficient documentation

## 2017-08-12 ENCOUNTER — Ambulatory Visit (INDEPENDENT_AMBULATORY_CARE_PROVIDER_SITE_OTHER): Payer: Medicare Other | Admitting: Orthopaedic Surgery

## 2017-08-12 ENCOUNTER — Encounter: Payer: Self-pay | Admitting: Orthopaedic Surgery

## 2017-08-12 VITALS — BP 149/90 | HR 75 | Temp 97.2°F | Ht 63.0 in | Wt 215.0 lb

## 2017-08-12 DIAGNOSIS — G8929 Other chronic pain: Secondary | ICD-10-CM

## 2017-08-12 DIAGNOSIS — M25512 Pain in left shoulder: Secondary | ICD-10-CM

## 2017-08-12 NOTE — Patient Instructions (Signed)
Steps to Quit Smoking Smoking tobacco can be bad for your health. It can also affect almost every organ in your body. Smoking puts you and people around you at risk for many serious long-lasting (chronic) diseases. Quitting smoking is hard, but it is one of the best things that you can do for your health. It is never too late to quit. What are the benefits of quitting smoking? When you quit smoking, you lower your risk for getting serious diseases and conditions. They can include:  Lung cancer or lung disease.  Heart disease.  Stroke.  Heart attack.  Not being able to have children (infertility).  Weak bones (osteoporosis) and broken bones (fractures).  If you have coughing, wheezing, and shortness of breath, those symptoms may get better when you quit. You may also get sick less often. If you are pregnant, quitting smoking can help to lower your chances of having a baby of low birth weight. What can I do to help me quit smoking? Talk with your doctor about what can help you quit smoking. Some things you can do (strategies) include:  Quitting smoking totally, instead of slowly cutting back how much you smoke over a period of time.  Going to in-person counseling. You are more likely to quit if you go to many counseling sessions.  Using resources and support systems, such as: ? Online chats with a counselor. ? Phone quitlines. ? Printed self-help materials. ? Support groups or group counseling. ? Text messaging programs. ? Mobile phone apps or applications.  Taking medicines. Some of these medicines may have nicotine in them. If you are pregnant or breastfeeding, do not take any medicines to quit smoking unless your doctor says it is okay. Talk with your doctor about counseling or other things that can help you.  Talk with your doctor about using more than one strategy at the same time, such as taking medicines while you are also going to in-person counseling. This can help make  quitting easier. What things can I do to make it easier to quit? Quitting smoking might feel very hard at first, but there is a lot that you can do to make it easier. Take these steps:  Talk to your family and friends. Ask them to support and encourage you.  Call phone quitlines, reach out to support groups, or work with a counselor.  Ask people who smoke to not smoke around you.  Avoid places that make you want (trigger) to smoke, such as: ? Bars. ? Parties. ? Smoke-break areas at work.  Spend time with people who do not smoke.  Lower the stress in your life. Stress can make you want to smoke. Try these things to help your stress: ? Getting regular exercise. ? Deep-breathing exercises. ? Yoga. ? Meditating. ? Doing a body scan. To do this, close your eyes, focus on one area of your body at a time from head to toe, and notice which parts of your body are tense. Try to relax the muscles in those areas.  Download or buy apps on your mobile phone or tablet that can help you stick to your quit plan. There are many free apps, such as QuitGuide from the CDC (Centers for Disease Control and Prevention). You can find more support from smokefree.gov and other websites.  This information is not intended to replace advice given to you by your health care provider. Make sure you discuss any questions you have with your health care provider. Document Released: 12/21/2008 Document   Revised: 10/23/2015 Document Reviewed: 07/11/2014 Elsevier Interactive Patient Education  2018 Elsevier Inc.  

## 2017-08-12 NOTE — Progress Notes (Signed)
Patient Alan Miller, male DOB:10/22/1970, 47 y.o. NUU:[id number]  Chief Complaint  Patient presents with  . Shoulder Pain    left  . Results    review MRI scan     HPI  Alan Miller is a 47 y.o. male who has had pain in the left shoulder.  He had a MRI done which showed: IMPRESSION: Mild edema about the musculotendinous junction of the infraspinatus most consistent with strain. Negative for rotator cuff tear.  Moderate acromioclavicular osteoarthritis.  I have explained the findings to him.  He does not need surgery. I will set up some therapy for his shoulder.  He can come out of the sling as tolerated.  He has chronic pain syndrome and is on chronic narcotics from another doctor.  He has cut back on his smoking to just three cigarettes a day now.  That is very good. HPI  Body mass index is 38.09 kg/m.  ROS  Review of Systems  Constitutional: Positive for activity change.  Respiratory: Negative for cough and shortness of breath.   Cardiovascular: Negative for chest pain and leg swelling.  Endocrine: Positive for cold intolerance.  Musculoskeletal: Positive for arthralgias, back pain, gait problem and joint swelling.  Allergic/Immunologic: Positive for environmental allergies.  Psychiatric/Behavioral: The patient is nervous/anxious.   All other systems reviewed and are negative.   Past Medical History:  Diagnosis Date  . Anxiety   . Depression   . Hypercholesteremia   . Hypertension     Past Surgical History:  Procedure Laterality Date  . BACK SURGERY    . LITHOTRIPSY      History reviewed. No pertinent family history.  Social History Social History   Tobacco Use  . Smoking status: Current Every Day Smoker    Packs/day: 1.50    Types: Cigarettes  . Smokeless tobacco: Never Used  Substance Use Topics  . Alcohol use: Yes    Comment: occ.  . Drug use: No    Allergies  Allergen Reactions  . Sulfa Antibiotics Other (See Comments)   Headache    Current Outpatient Medications  Medication Sig Dispense Refill  . ALPRAZolam (XANAX) 0.5 MG tablet Take 0.5 mg by mouth 2 (two) times daily.    Marland Kitchen amLODipine (NORVASC) 5 MG tablet Take 5 mg by mouth at bedtime.    Marland Kitchen aspirin EC 81 MG tablet Take 81 mg by mouth daily.    . Aspirin-Acetaminophen (GOODY BODY PAIN) 500-325 MG PACK Take 1 packet by mouth every 6 (six) hours as needed (Pain).    Marland Kitchen BREO ELLIPTA 200-25 MCG/INH AEPB     . buPROPion (WELLBUTRIN XL) 300 MG 24 hr tablet Take 300 mg by mouth daily.    Marland Kitchen EDARBI 80 MG TABS Take 80 mg by mouth daily.    . fenofibrate 160 MG tablet     . gabapentin (NEURONTIN) 600 MG tablet Take 600 mg by mouth 3 (three) times daily.    . Iron-Folic Acid-Vit H47 (IRON FORMULA PO) Take by mouth.    . levocetirizine (XYZAL) 5 MG tablet TAKE 1 TABLET BY MOUTH AT BEDTIME - EMERGENCY REFILL FAXED DR.  0  . meloxicam (MOBIC) 15 MG tablet Take 15 mg by mouth daily.  3  . montelukast (SINGULAIR) 10 MG tablet     . naphazoline-glycerin (CLEAR EYES) 0.012-0.2 % SOLN Place 1-2 drops into both eyes every 4 (four) hours as needed for irritation.    . Omega-3 Fatty Acids (FISH OIL) 1000 MG CAPS Take by mouth.    Marland Kitchen  oxyCODONE-acetaminophen (PERCOCET) 10-325 MG tablet     . PROAIR HFA 108 (90 BASE) MCG/ACT inhaler Inhale 2 puffs into the lungs every 4 (four) hours as needed.    . sertraline (ZOLOFT) 50 MG tablet Take 50 mg by mouth 2 (two) times daily.  4  . tiZANidine (ZANAFLEX) 4 MG tablet Take 4 mg by mouth every 8 (eight) hours as needed.  2  . traMADol (ULTRAM) 50 MG tablet Take 100 mg by mouth 3 (three) times daily.    Marland Kitchen oxyCODONE-acetaminophen (PERCOCET/ROXICET) 5-325 MG tablet Take 1 tablet by mouth every 6 (six) hours as needed. (Patient not taking: Reported on 08/12/2017) 30 tablet 0   No current facility-administered medications for this visit.      Physical Exam  Blood pressure (!) 149/90, pulse 75, temperature (!) 97.2 F (36.2 C), height 5\' 3"   (1.6 m), weight 215 lb (97.5 kg).  Constitutional: overall normal hygiene, normal nutrition, well developed, normal grooming, normal body habitus. Assistive device:sling  Musculoskeletal: gait and station Limp none, muscle tone and strength are normal, no tremors or atrophy is present.  .  Neurological: coordination overall normal.  Deep tendon reflex/nerve stretch intact.  Sensation normal.  Cranial nerves II-XII intact.   Skin:   Normal overall no scars, lesions, ulcers or rashes. No psoriasis.  Psychiatric: Alert and oriented x 3.  Recent memory intact, remote memory unclear.  Normal mood and affect. Well groomed.  Good eye contact.  Cardiovascular: overall no swelling, no varicosities, no edema bilaterally, normal temperatures of the legs and arms, no clubbing, cyanosis and good capillary refill.  Lymphatic: palpation is normal.  Left shoulder has pain and decreased ROM secondary to pain.  He is in a sling.  NV is intact.  All other systems reviewed and are negative   The patient has been educated about the nature of the problem(s) and counseled on treatment options.  The patient appeared to understand what I have discussed and is in agreement with it.  Encounter Diagnosis  Name Primary?  . Chronic left shoulder pain Yes    PLAN Call if any problems.  Precautions discussed.  Continue current medications.   Return to clinic 2 weeks   Begin OT.  Electronically Signed Sanjuana Kava, MD 6/5/20198:48 AM

## 2017-08-13 NOTE — Addendum Note (Signed)
Addended by: Moreen Fowler R on: 08/13/2017 10:36 AM   Modules accepted: Orders

## 2017-08-17 ENCOUNTER — Other Ambulatory Visit: Payer: Self-pay

## 2017-08-17 ENCOUNTER — Encounter (HOSPITAL_COMMUNITY): Payer: Self-pay | Admitting: Specialist

## 2017-08-17 ENCOUNTER — Ambulatory Visit (HOSPITAL_COMMUNITY): Payer: Medicare Other | Attending: Orthopaedic Surgery | Admitting: Specialist

## 2017-08-17 DIAGNOSIS — M25512 Pain in left shoulder: Secondary | ICD-10-CM | POA: Diagnosis not present

## 2017-08-17 NOTE — Therapy (Signed)
Alan Miller, [place], [address] Phone: [phone]   Fax:  [phone]  Occupational Therapy Evaluation  Alan Miller Details  Name: Alan Miller MRN: [id number] Date of Birth: 1971/02/27 Referring Provider: Dr. Sanjuana Kava   Encounter Date: 08/17/2017  OT End of Session - 08/17/17 1454    Visit Number  1    Number of Visits  1    Date for OT Re-Evaluation  08/24/17    Authorization Type  [employer] Medicare     OT Start Time  1115    OT Stop Time  1200    OT Time Calculation (min)  45 min    Activity Tolerance  Alan Miller tolerated treatment well    Behavior During Therapy  [facility] for tasks assessed/performed       Past Medical History:  Diagnosis Date  . Anxiety   . Depression   . Hypercholesteremia   . Hypertension     Past Surgical History:  Procedure Laterality Date  . BACK SURGERY    . LITHOTRIPSY      There were no vitals filed for this visit.  Subjective Assessment - 08/17/17 1451    Subjective   My shoulder was bothering me for a few months.  I had a cortisone shot and now it feels alot better.  I could live with it.     Pertinent History  Alan Miller has been experiencing pain in his left shoulder when raising to 90 degrees abduction.  He consulted with Dr. Luna Glasgow and had an xray and a MRI.  Both were negative for any rotator cuff damage.  Alan Miller has been referred to occupational therapy for evaluation and treatment.    Special Tests  FOTO 83%    Alan Miller Stated Goals  I want to get my arm back to not hurting    Currently in Pain?  Yes    Pain Score  4     Pain Location  Shoulder    Pain Orientation  Left    Pain Descriptors / Indicators  Aching    Pain Type  Acute pain    Pain Radiating Towards  upper arm    Pain Onset  More than a month ago    Pain Frequency  Intermittent    Aggravating Factors   abduction to 90    Pain Relieving Factors  rest    Effect of Pain on Alan Activities  minimal         [facility] OT Assessment - 08/17/17 0001      Assessment   Medical Diagnosis  Left Shoulder Pain    Referring Provider  Dr. Sanjuana Kava    Onset Date/Surgical Date  -- 2 months ago    Hand Dominance  Right    Next MD Visit  -- unknown    Prior Therapy  none      Precautions   Precautions  None      Restrictions   Weight Bearing Restrictions  No      Balance Screen   Has the Alan Miller fallen in the past 6 months  No    Has the Alan Miller had a decrease in activity level because of a fear of falling?   No    Is the Alan Miller reluctant to leave their home because of a fear of falling?   No      Home  Environment   Family/Alan Miller expects to be discharged to:  Private residence    Lives  With  Spouse      Prior Function   Level of Independence  Independent;Independent with basic ADLs    Vocation  On disability    Leisure  working around the house      ADL   ADL comments  Alan Miller reports independence with all desired Alan activities       Written Expression   Dominant Hand  Right      Vision - History   Baseline Vision  No visual deficits      Cognition   Overall Cognitive Status  Within Functional Limits for tasks assessed      Observation/Other Assessments   Focus on Therapeutic Outcomes (FOTO)   83/100      Sensation   Light Touch  Appears Intact      Coordination   Gross Motor Movements are Fluid and Coordinated  Yes    Fine Motor Movements are Fluid and Coordinated  Yes      ROM / Strength   AROM / PROM / Strength  AROM;Strength      Palpation   Palpation comment  trace fascial restrictions noted in left shoulder region      AROM   Overall AROM Comments  BUE A/ROM is WNL      Strength   Overall Strength Comments  BUE strength is WNL               OT Treatments/Exercises (OP) - 08/17/17 0001      Exercises   Exercises  Shoulder      Shoulder Exercises: Standing   External Rotation  Theraband;5 reps    Theraband Level (Shoulder External  Rotation)  Level 2 (Red)    Internal Rotation  Theraband;5 reps    Theraband Level (Shoulder Internal Rotation)  Level 2 (Red)    Extension  Theraband;5 reps    Theraband Level (Shoulder Extension)  Level 2 (Red)    Row  Theraband;5 reps    Theraband Level (Shoulder Row)  Level 2 (Red)    Retraction  Theraband;5 reps    Theraband Level (Shoulder Retraction)  Level 2 (Red)      Shoulder Exercises: Stretch   Corner Stretch  1 rep;10 seconds    Cross Chest Stretch  1 rep;10 seconds    External Rotation Stretch  1 rep;10 seconds    Wall Stretch - Flexion  1 rep;10 seconds    Wall Stretch - ABduction  1 rep;10 seconds            OT Education - 08/17/17 1454    Education Details  educated on HEP for shoulder stretches and scapular stabiilty exercises with min resist red theraband     Person(s) Educated  Alan Miller    Methods  Explanation;Demonstration;Handout    Comprehension  Verbalized understanding;Returned demonstration       OT Short Term Goals - 08/17/17 1459      OT SHORT TERM GOAL #1   Title  Alan Miller will be independent with HEP for shoulder stretches and scapular stability.     Time  1    Period  Weeks    Status  Achieved    Target Date  08/24/17               Plan - 08/17/17 1455    Clinical Impression Statement  A:  Alan Miller is a 47 year old male with past medical history significant for hypertension, arthritis, COPD, headaches, and back surgery.  He has been referred to occupational therapy  for evaluation and treatment of left shoulder pain.  Alan Miller demonstrates WNL A/ROM and strength in his left shoulder this date.  He does experence 5/10 pain when reaching into abduction and 90 degree range.  However, he states it doesnt stop him from doing any desired activities.  Alan Miller does not demonstrate skilled need for skilled OT intervention at this time.  Alan Miller was educated on HEP for shoulder stretches, and proximal shoulder strengthening with theraband.  Alan Miller  is agreeable to HEP vs in clinic session.      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  Alan Miller agreeable to dc.     Clinical Decision Making  Limited treatment options, no task modification necessary    OT Home Exercise Plan  shoulder stretches and scapular stability.    Consulted and Agree with Plan of Care  Alan Miller       Alan Miller will benefit from skilled therapeutic intervention in order to improve the following deficits and impairments:  Pain  Visit Diagnosis: Acute pain of left shoulder - Plan: Ot plan of care cert/re-cert    Problem List There are no active problems to display for this Alan Miller.   Alan Miller, [place], OTR/L [phone]  08/17/2017, 3:06 PM  [facility] [address] [place], [place], [address] Phone: [phone]   Fax:  [phone]  Name: Alan Miller MRN: [id number] Date of Birth: 14-May-1970

## 2017-08-17 NOTE — Patient Instructions (Signed)
Complete each exercise 10 times each, 2 times per day  (Home) Extension: Isometric / Bilateral Arm Retraction - Sitting     Facing anchor, hold hands and elbow at shoulder height, with elbow bent.  Pull arms back to squeeze shoulder blades together. Repeat 10-15 times. 1-3 times/day.   (Clinic) Extension / Flexion (Assist)   Face anchor, pull arms back, keeping elbow straight, and squeze shoulder blades together. Repeat 10-15 times. 1-3 times/day.   Copyright  VHI. All rights reserved.   (Home) Retraction: Row - Bilateral (Anchor)   Facing anchor, arms reaching forward, pull hands toward stomach, keeping elbows bent and at your sides and pinching shoulder blades together. Repeat 10-15 times. 1-3 times/day.   Copyright  VHI. All rights reserved.  (Home) External Rotation: With Abduction - Sitting    Opposite side toward anchor, right arm supported, elbow out, bent to 90, forearm across body. Rotate shoulder by pulling hand away from body. Keep elbow bent to 90. Repeat ____ times per set. Do ____ sets per session. Do ____ sessions per week. Use ____ lb weights.  Copyright  VHI. All rights reserved.  Strengthening: Resisted Internal Rotation    Hold tubing in left hand, elbow at side and forearm out. Rotate forearm in across body. Repeat ____ times per set. Do ____ sets per session. Do ____ sessions per day.  http://orth.exer.us/830   Copyright  VHI. All rights reserved.  Complete the following exercises 2-3 times a day.  Doorway Stretch  Place each hand opposite each other on the doorway. (You can change where you feel the stretch by moving arms higher or lower.) Step through with one foot and bend front knee until a stretch is felt and hold. Step through with the opposite foot on the next rep. Hold for __10-15___ seconds. Repeat __2__times.     Scapular Retraction (Standing)   With arms at sides, pinch shoulder blades together. Repeat __10__ times per set.  Do __1__ sets per session. Do __2__ sessions per day.  http://orth.exer.us/944   Copyright  VHI. All rights reserved.   Internal Rotation Across Back  Grab the end of a towel with your affected side, palm facing backwards. Grab the towel with your unaffected side and pull your affected hand across your back until you feel a stretch in the front of your shoulder. If you feel pain, pull just to the pain, do not pull through the pain. Hold. Return your affected arm to your side. Try to keep your hand/arm close to your body during the entire movement.     Hold for 10-15 seconds. Complete 2 times.        Posterior Capsule Stretch   Stand or sit, one arm across body so hand rests over opposite shoulder. Gently push on crossed elbow with other hand until stretch is felt in shoulder of crossed arm. Hold _10-15__ seconds.  Repeat _2__ times per session. Do ___ sessions per day.   Wall Flexion  Slide your arm up the wall or door frame until a stretch is felt in your shoulder . Hold for 10-15 seconds. Complete 2 times     Shoulder Abduction Stretch  Stand side ways by a wall with affected up on wall. Gently step in toward wall to feel stretch. Hold for 10-15 seconds. Complete 2 times.

## 2017-08-19 ENCOUNTER — Encounter (HOSPITAL_COMMUNITY): Payer: Medicare Other | Admitting: Occupational Therapy

## 2017-08-25 ENCOUNTER — Encounter (HOSPITAL_COMMUNITY): Payer: Medicare Other

## 2017-08-26 ENCOUNTER — Ambulatory Visit: Payer: Medicare Other | Admitting: Orthopaedic Surgery

## 2017-08-27 ENCOUNTER — Encounter: Payer: Self-pay | Admitting: Orthopaedic Surgery

## 2017-08-27 ENCOUNTER — Encounter (HOSPITAL_COMMUNITY): Payer: Medicare Other

## 2017-09-02 DIAGNOSIS — G894 Chronic pain syndrome: Secondary | ICD-10-CM | POA: Diagnosis not present

## 2017-09-02 DIAGNOSIS — G8929 Other chronic pain: Secondary | ICD-10-CM | POA: Diagnosis not present

## 2017-09-02 DIAGNOSIS — M5441 Lumbago with sciatica, right side: Secondary | ICD-10-CM | POA: Diagnosis not present

## 2017-09-02 DIAGNOSIS — Z79899 Other long term (current) drug therapy: Secondary | ICD-10-CM | POA: Diagnosis not present

## 2017-09-30 DIAGNOSIS — Z79899 Other long term (current) drug therapy: Secondary | ICD-10-CM | POA: Diagnosis not present

## 2017-09-30 DIAGNOSIS — Z87891 Personal history of nicotine dependence: Secondary | ICD-10-CM | POA: Diagnosis not present

## 2017-09-30 DIAGNOSIS — G8929 Other chronic pain: Secondary | ICD-10-CM | POA: Diagnosis not present

## 2017-09-30 DIAGNOSIS — M5441 Lumbago with sciatica, right side: Secondary | ICD-10-CM | POA: Diagnosis not present

## 2017-10-09 ENCOUNTER — Other Ambulatory Visit: Payer: Self-pay

## 2017-10-09 NOTE — Patient Outreach (Signed)
[facility]) Care Management  10/09/2017  Jazir Newey 1970-05-27 [id number]   Medication Adherence call to Mr. Delrico Minehart spoke with patient he is due on Edarbi 80 mg he said he has been cutting it in half ,he feels his blood pressure is better well keep patient will be refer to Almyra Free the pharmacist. Mr. Einstein is showing past due under [place].   [facility] Management Direct Dial [phone]  Fax [phone] [email or web address]

## 2017-10-20 DIAGNOSIS — D72829 Elevated white blood cell count, unspecified: Secondary | ICD-10-CM | POA: Diagnosis not present

## 2017-10-20 DIAGNOSIS — E781 Pure hyperglyceridemia: Secondary | ICD-10-CM | POA: Diagnosis not present

## 2017-10-20 DIAGNOSIS — G894 Chronic pain syndrome: Secondary | ICD-10-CM | POA: Diagnosis not present

## 2017-10-20 DIAGNOSIS — D649 Anemia, unspecified: Secondary | ICD-10-CM | POA: Diagnosis not present

## 2017-10-20 DIAGNOSIS — M1990 Unspecified osteoarthritis, unspecified site: Secondary | ICD-10-CM | POA: Diagnosis not present

## 2017-10-22 ENCOUNTER — Other Ambulatory Visit: Payer: Self-pay | Admitting: Pharmacist

## 2017-10-22 NOTE — Patient Outreach (Signed)
Alan Miller [facility]) Care Management  10/22/2017  Alan Miller 11-10-1970 [id number]   47 y.o. year old male referred to [facility] for Medication Adherence Alan Miller) Referred by Alan Miller, [facility] CPhT. Alan Miller outreached to the patient for Edarbi adherence based on [facility] lists and discovered the patient had been taking half tablets of Edarbi.  Patient with [employer].   Patient confirms identity with HIPAA-identifiers x2 and gives verbal consent to speak over the phone about medications.   Medication Adherence: Patient last picked up Edarbi 80mg  in February 2019. He has a home BP monitor and checks his BP daily. His home monitor has green, yellow, and red zones. If his BP is in the green zone, he will not take a dose of Edarbi. If yellow zone, he will take 1/2 tablet of Edarbi, and in red zone he will take a whole tablet of Edarbi.   Patient states he started doing this because he feels his blood pressure gets too low, causing him to get dizzy and pass out. The last time he passed out was about 10-12 months ago.   Plan: Called Dr. Olivia Miller office (primary care provider) to leave a message for Dr. Nevada Miller. Patient has an appointment tomorrow, 10/23/17. Encouraged patient to bring up blood pressure to Dr. Nevada Miller as well. Gave patient my contact information should he have any [facility] clinical pharmacy need in the future. Patient vocalized understanding.   Ruben Reason, PharmD Clinical Pharmacist, [facility] [phone]

## 2017-10-23 DIAGNOSIS — I10 Essential (primary) hypertension: Secondary | ICD-10-CM | POA: Diagnosis not present

## 2017-10-23 DIAGNOSIS — R7301 Impaired fasting glucose: Secondary | ICD-10-CM | POA: Diagnosis not present

## 2017-10-23 DIAGNOSIS — E782 Mixed hyperlipidemia: Secondary | ICD-10-CM | POA: Diagnosis not present

## 2017-10-23 DIAGNOSIS — R944 Abnormal results of kidney function studies: Secondary | ICD-10-CM | POA: Diagnosis not present

## 2017-10-23 DIAGNOSIS — G894 Chronic pain syndrome: Secondary | ICD-10-CM | POA: Diagnosis not present

## 2017-10-28 DIAGNOSIS — Z79899 Other long term (current) drug therapy: Secondary | ICD-10-CM | POA: Diagnosis not present

## 2017-10-28 DIAGNOSIS — M5440 Lumbago with sciatica, unspecified side: Secondary | ICD-10-CM | POA: Diagnosis not present

## 2017-11-25 DIAGNOSIS — Z79899 Other long term (current) drug therapy: Secondary | ICD-10-CM | POA: Diagnosis not present

## 2017-11-25 DIAGNOSIS — Z87891 Personal history of nicotine dependence: Secondary | ICD-10-CM | POA: Diagnosis not present

## 2017-11-25 DIAGNOSIS — M5440 Lumbago with sciatica, unspecified side: Secondary | ICD-10-CM | POA: Diagnosis not present
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
## 2017-12-14 ENCOUNTER — Ambulatory Visit (HOSPITAL_COMMUNITY): Payer: Medicare Other | Attending: Internal Medicine

## 2017-12-24 DIAGNOSIS — Z79899 Other long term (current) drug therapy: Secondary | ICD-10-CM | POA: Diagnosis not present

## 2017-12-24 DIAGNOSIS — Z87891 Personal history of nicotine dependence: Secondary | ICD-10-CM | POA: Diagnosis not present

## 2017-12-24 DIAGNOSIS — M5440 Lumbago with sciatica, unspecified side: Secondary | ICD-10-CM | POA: Diagnosis not present

## 2018-01-22 DIAGNOSIS — Z79899 Other long term (current) drug therapy: Secondary | ICD-10-CM | POA: Diagnosis not present

## 2018-01-22 DIAGNOSIS — G8929 Other chronic pain: Secondary | ICD-10-CM | POA: Diagnosis not present

## 2018-01-22 DIAGNOSIS — M5441 Lumbago with sciatica, right side: Secondary | ICD-10-CM | POA: Diagnosis not present

## 2018-01-22 DIAGNOSIS — Z87891 Personal history of nicotine dependence: Secondary | ICD-10-CM | POA: Diagnosis not present

## 2018-01-22 DIAGNOSIS — I10 Essential (primary) hypertension: Secondary | ICD-10-CM | POA: Diagnosis not present

## 2018-02-01 DIAGNOSIS — D72829 Elevated white blood cell count, unspecified: Secondary | ICD-10-CM | POA: Diagnosis not present

## 2018-02-01 DIAGNOSIS — D649 Anemia, unspecified: Secondary | ICD-10-CM | POA: Diagnosis not present

## 2018-02-01 DIAGNOSIS — E781 Pure hyperglyceridemia: Secondary | ICD-10-CM | POA: Diagnosis not present

## 2018-02-01 DIAGNOSIS — R7301 Impaired fasting glucose: Secondary | ICD-10-CM | POA: Diagnosis not present

## 2018-02-01 DIAGNOSIS — G894 Chronic pain syndrome: Secondary | ICD-10-CM | POA: Diagnosis not present

## 2018-02-02 DIAGNOSIS — K5900 Constipation, unspecified: Secondary | ICD-10-CM | POA: Diagnosis not present

## 2018-02-02 DIAGNOSIS — E782 Mixed hyperlipidemia: Secondary | ICD-10-CM | POA: Diagnosis not present

## 2018-02-02 DIAGNOSIS — I10 Essential (primary) hypertension: Secondary | ICD-10-CM | POA: Diagnosis not present

## 2018-02-16 DIAGNOSIS — M545 Low back pain: Secondary | ICD-10-CM | POA: Diagnosis not present

## 2018-02-16 DIAGNOSIS — Z79899 Other long term (current) drug therapy: Secondary | ICD-10-CM | POA: Diagnosis not present

## 2018-02-16 DIAGNOSIS — G8929 Other chronic pain: Secondary | ICD-10-CM | POA: Diagnosis not present

## 2018-02-16 DIAGNOSIS — M5441 Lumbago with sciatica, right side: Secondary | ICD-10-CM | POA: Diagnosis not present

## 2018-03-21 DIAGNOSIS — G8929 Other chronic pain: Secondary | ICD-10-CM | POA: Diagnosis not present

## 2018-03-21 DIAGNOSIS — M5441 Lumbago with sciatica, right side: Secondary | ICD-10-CM | POA: Diagnosis not present

## 2018-03-21 DIAGNOSIS — Z79899 Other long term (current) drug therapy: Secondary | ICD-10-CM | POA: Diagnosis not present

## 2018-04-19 DIAGNOSIS — Z79899 Other long term (current) drug therapy: Secondary | ICD-10-CM | POA: Diagnosis not present

## 2018-04-19 DIAGNOSIS — I10 Essential (primary) hypertension: Secondary | ICD-10-CM | POA: Diagnosis not present

## 2018-04-19 DIAGNOSIS — M5441 Lumbago with sciatica, right side: Secondary | ICD-10-CM | POA: Diagnosis not present

## 2018-04-19 DIAGNOSIS — G8929 Other chronic pain: Secondary | ICD-10-CM | POA: Diagnosis not present

## 2018-05-17 DIAGNOSIS — Z79899 Other long term (current) drug therapy: Secondary | ICD-10-CM | POA: Diagnosis not present

## 2018-05-17 DIAGNOSIS — M5441 Lumbago with sciatica, right side: Secondary | ICD-10-CM | POA: Diagnosis not present

## 2018-05-17 DIAGNOSIS — G8929 Other chronic pain: Secondary | ICD-10-CM | POA: Diagnosis not present

## 2018-05-28 DIAGNOSIS — E781 Pure hyperglyceridemia: Secondary | ICD-10-CM | POA: Diagnosis not present

## 2018-05-28 DIAGNOSIS — E782 Mixed hyperlipidemia: Secondary | ICD-10-CM | POA: Diagnosis not present

## 2018-06-14 DIAGNOSIS — M5441 Lumbago with sciatica, right side: Secondary | ICD-10-CM | POA: Diagnosis not present

## 2018-06-14 DIAGNOSIS — Z79899 Other long term (current) drug therapy: Secondary | ICD-10-CM | POA: Diagnosis not present

## 2018-06-14 DIAGNOSIS — G8929 Other chronic pain: Secondary | ICD-10-CM | POA: Diagnosis not present

## 2018-06-29 DIAGNOSIS — R220 Localized swelling, mass and lump, head: Secondary | ICD-10-CM | POA: Diagnosis not present

## 2018-06-29 DIAGNOSIS — R040 Epistaxis: Secondary | ICD-10-CM | POA: Diagnosis not present

## 2018-06-29 DIAGNOSIS — G501 Atypical facial pain: Secondary | ICD-10-CM | POA: Diagnosis not present

## 2018-07-13 DIAGNOSIS — E781 Pure hyperglyceridemia: Secondary | ICD-10-CM | POA: Diagnosis not present

## 2018-07-13 DIAGNOSIS — J441 Chronic obstructive pulmonary disease with (acute) exacerbation: Secondary | ICD-10-CM | POA: Diagnosis not present

## 2018-07-13 DIAGNOSIS — E782 Mixed hyperlipidemia: Secondary | ICD-10-CM | POA: Diagnosis not present

## 2018-07-13 DIAGNOSIS — I10 Essential (primary) hypertension: Secondary | ICD-10-CM | POA: Diagnosis not present

## 2018-07-14 DIAGNOSIS — G8929 Other chronic pain: Secondary | ICD-10-CM | POA: Diagnosis not present

## 2018-07-14 DIAGNOSIS — Z87891 Personal history of nicotine dependence: Secondary | ICD-10-CM | POA: Diagnosis not present

## 2018-07-14 DIAGNOSIS — M5441 Lumbago with sciatica, right side: Secondary | ICD-10-CM | POA: Diagnosis not present

## 2018-07-14 DIAGNOSIS — Z79899 Other long term (current) drug therapy: Secondary | ICD-10-CM | POA: Diagnosis not present

## 2018-07-22 DIAGNOSIS — Z0000 Encounter for general adult medical examination without abnormal findings: Secondary | ICD-10-CM | POA: Diagnosis not present

## 2018-08-10 DIAGNOSIS — E782 Mixed hyperlipidemia: Secondary | ICD-10-CM | POA: Diagnosis not present

## 2018-08-10 DIAGNOSIS — D649 Anemia, unspecified: Secondary | ICD-10-CM | POA: Diagnosis not present

## 2018-08-10 DIAGNOSIS — R7301 Impaired fasting glucose: Secondary | ICD-10-CM | POA: Diagnosis not present

## 2018-08-10 DIAGNOSIS — E781 Pure hyperglyceridemia: Secondary | ICD-10-CM | POA: Diagnosis not present

## 2018-08-10 DIAGNOSIS — I10 Essential (primary) hypertension: Secondary | ICD-10-CM | POA: Diagnosis not present

## 2018-08-13 DIAGNOSIS — I10 Essential (primary) hypertension: Secondary | ICD-10-CM | POA: Diagnosis not present

## 2018-08-13 DIAGNOSIS — Z1159 Encounter for screening for other viral diseases: Secondary | ICD-10-CM | POA: Diagnosis not present

## 2018-08-13 DIAGNOSIS — G8929 Other chronic pain: Secondary | ICD-10-CM | POA: Diagnosis not present

## 2018-08-13 DIAGNOSIS — E782 Mixed hyperlipidemia: Secondary | ICD-10-CM | POA: Diagnosis not present

## 2018-08-13 DIAGNOSIS — G894 Chronic pain syndrome: Secondary | ICD-10-CM | POA: Diagnosis not present

## 2018-08-13 DIAGNOSIS — M5441 Lumbago with sciatica, right side: Secondary | ICD-10-CM | POA: Diagnosis not present

## 2018-08-13 DIAGNOSIS — Z79899 Other long term (current) drug therapy: Secondary | ICD-10-CM | POA: Diagnosis not present

## 2018-08-13 DIAGNOSIS — K5900 Constipation, unspecified: Secondary | ICD-10-CM | POA: Diagnosis not present

## 2018-08-17 DIAGNOSIS — U071 COVID-19: Secondary | ICD-10-CM | POA: Diagnosis not present

## 2018-08-17 DIAGNOSIS — Z9189 Other specified personal risk factors, not elsewhere classified: Secondary | ICD-10-CM | POA: Diagnosis not present

## 2018-09-13 DIAGNOSIS — Z87891 Personal history of nicotine dependence: Secondary | ICD-10-CM | POA: Diagnosis not present

## 2018-09-13 DIAGNOSIS — M5441 Lumbago with sciatica, right side: Secondary | ICD-10-CM | POA: Diagnosis not present

## 2018-09-13 DIAGNOSIS — Z79899 Other long term (current) drug therapy: Secondary | ICD-10-CM | POA: Diagnosis not present

## 2018-09-13 DIAGNOSIS — G8929 Other chronic pain: Secondary | ICD-10-CM | POA: Diagnosis not present

## 2018-09-27 DIAGNOSIS — M545 Low back pain: Secondary | ICD-10-CM | POA: Diagnosis not present

## 2018-09-27 DIAGNOSIS — R809 Proteinuria, unspecified: Secondary | ICD-10-CM | POA: Diagnosis not present

## 2018-09-27 DIAGNOSIS — Z87442 Personal history of urinary calculi: Secondary | ICD-10-CM | POA: Diagnosis not present

## 2018-09-28 DIAGNOSIS — J441 Chronic obstructive pulmonary disease with (acute) exacerbation: Secondary | ICD-10-CM | POA: Diagnosis not present

## 2018-09-28 DIAGNOSIS — E782 Mixed hyperlipidemia: Secondary | ICD-10-CM | POA: Diagnosis not present

## 2018-09-28 DIAGNOSIS — E781 Pure hyperglyceridemia: Secondary | ICD-10-CM | POA: Diagnosis not present

## 2018-09-29 DIAGNOSIS — Z1159 Encounter for screening for other viral diseases: Secondary | ICD-10-CM | POA: Diagnosis not present

## 2018-09-29 DIAGNOSIS — N200 Calculus of kidney: Secondary | ICD-10-CM | POA: Diagnosis not present

## 2018-09-29 DIAGNOSIS — G8929 Other chronic pain: Secondary | ICD-10-CM | POA: Diagnosis not present

## 2018-09-29 DIAGNOSIS — Z0184 Encounter for antibody response examination: Secondary | ICD-10-CM | POA: Diagnosis not present

## 2018-09-29 DIAGNOSIS — Z79891 Long term (current) use of opiate analgesic: Secondary | ICD-10-CM | POA: Diagnosis not present

## 2018-09-29 DIAGNOSIS — M5441 Lumbago with sciatica, right side: Secondary | ICD-10-CM | POA: Diagnosis not present

## 2018-10-07 DIAGNOSIS — M545 Low back pain: Secondary | ICD-10-CM | POA: Diagnosis not present

## 2018-10-07 DIAGNOSIS — N200 Calculus of kidney: Secondary | ICD-10-CM | POA: Diagnosis not present

## 2018-10-11 DIAGNOSIS — Z79899 Other long term (current) drug therapy: Secondary | ICD-10-CM | POA: Diagnosis not present

## 2018-10-11 DIAGNOSIS — N200 Calculus of kidney: Secondary | ICD-10-CM | POA: Diagnosis not present

## 2018-10-11 DIAGNOSIS — M5441 Lumbago with sciatica, right side: Secondary | ICD-10-CM | POA: Diagnosis not present

## 2018-10-11 DIAGNOSIS — G8929 Other chronic pain: Secondary | ICD-10-CM | POA: Diagnosis not present

## 2018-11-12 DIAGNOSIS — E781 Pure hyperglyceridemia: Secondary | ICD-10-CM | POA: Diagnosis not present

## 2018-11-12 DIAGNOSIS — G8929 Other chronic pain: Secondary | ICD-10-CM | POA: Diagnosis not present

## 2018-11-12 DIAGNOSIS — Z79899 Other long term (current) drug therapy: Secondary | ICD-10-CM | POA: Diagnosis not present

## 2018-11-12 DIAGNOSIS — E782 Mixed hyperlipidemia: Secondary | ICD-10-CM | POA: Diagnosis not present

## 2018-11-12 DIAGNOSIS — M5441 Lumbago with sciatica, right side: Secondary | ICD-10-CM | POA: Diagnosis not present

## 2018-11-12 DIAGNOSIS — J441 Chronic obstructive pulmonary disease with (acute) exacerbation: Secondary | ICD-10-CM | POA: Diagnosis not present

## 2018-11-12 DIAGNOSIS — Z1159 Encounter for screening for other viral diseases: Secondary | ICD-10-CM | POA: Diagnosis not present

## 2018-12-09 DIAGNOSIS — M5441 Lumbago with sciatica, right side: Secondary | ICD-10-CM | POA: Diagnosis not present

## 2018-12-09 DIAGNOSIS — G8929 Other chronic pain: Secondary | ICD-10-CM | POA: Diagnosis not present

## 2018-12-09 DIAGNOSIS — Z79891 Long term (current) use of opiate analgesic: Secondary | ICD-10-CM | POA: Diagnosis not present

## 2018-12-09 DIAGNOSIS — Z1159 Encounter for screening for other viral diseases: Secondary | ICD-10-CM | POA: Diagnosis not present

## 2018-12-09 DIAGNOSIS — Z79899 Other long term (current) drug therapy: Secondary | ICD-10-CM | POA: Diagnosis not present

## 2018-12-14 DIAGNOSIS — E781 Pure hyperglyceridemia: Secondary | ICD-10-CM | POA: Diagnosis not present

## 2018-12-14 DIAGNOSIS — J441 Chronic obstructive pulmonary disease with (acute) exacerbation: Secondary | ICD-10-CM | POA: Diagnosis not present

## 2018-12-14 DIAGNOSIS — E782 Mixed hyperlipidemia: Secondary | ICD-10-CM | POA: Diagnosis not present

## 2019-01-11 DIAGNOSIS — Z1159 Encounter for screening for other viral diseases: Secondary | ICD-10-CM | POA: Diagnosis not present

## 2019-01-11 DIAGNOSIS — M5441 Lumbago with sciatica, right side: Secondary | ICD-10-CM | POA: Diagnosis not present

## 2019-01-11 DIAGNOSIS — G8929 Other chronic pain: Secondary | ICD-10-CM | POA: Diagnosis not present

## 2019-01-11 DIAGNOSIS — Z79899 Other long term (current) drug therapy: Secondary | ICD-10-CM | POA: Diagnosis not present

## 2019-01-25 DIAGNOSIS — E781 Pure hyperglyceridemia: Secondary | ICD-10-CM | POA: Diagnosis not present

## 2019-01-25 DIAGNOSIS — E782 Mixed hyperlipidemia: Secondary | ICD-10-CM | POA: Diagnosis not present

## 2019-01-25 DIAGNOSIS — J441 Chronic obstructive pulmonary disease with (acute) exacerbation: Secondary | ICD-10-CM | POA: Diagnosis not present

## 2019-02-08 DIAGNOSIS — Z1159 Encounter for screening for other viral diseases: Secondary | ICD-10-CM | POA: Diagnosis not present

## 2019-02-08 DIAGNOSIS — M5441 Lumbago with sciatica, right side: Secondary | ICD-10-CM | POA: Diagnosis not present

## 2019-02-08 DIAGNOSIS — G8929 Other chronic pain: Secondary | ICD-10-CM | POA: Diagnosis not present

## 2019-02-08 DIAGNOSIS — Z79899 Other long term (current) drug therapy: Secondary | ICD-10-CM | POA: Diagnosis not present

## 2019-02-11 DIAGNOSIS — D649 Anemia, unspecified: Secondary | ICD-10-CM | POA: Diagnosis not present

## 2019-02-11 DIAGNOSIS — E782 Mixed hyperlipidemia: Secondary | ICD-10-CM | POA: Diagnosis not present

## 2019-02-11 DIAGNOSIS — I10 Essential (primary) hypertension: Secondary | ICD-10-CM | POA: Diagnosis not present

## 2019-02-11 DIAGNOSIS — R7301 Impaired fasting glucose: Secondary | ICD-10-CM | POA: Diagnosis not present

## 2019-02-16 DIAGNOSIS — I10 Essential (primary) hypertension: Secondary | ICD-10-CM | POA: Diagnosis not present

## 2019-02-16 DIAGNOSIS — E782 Mixed hyperlipidemia: Secondary | ICD-10-CM | POA: Diagnosis not present

## 2019-02-16 DIAGNOSIS — Z0001 Encounter for general adult medical examination with abnormal findings: Secondary | ICD-10-CM | POA: Diagnosis not present

## 2019-02-23 DIAGNOSIS — I10 Essential (primary) hypertension: Secondary | ICD-10-CM | POA: Diagnosis not present

## 2019-02-23 DIAGNOSIS — J441 Chronic obstructive pulmonary disease with (acute) exacerbation: Secondary | ICD-10-CM | POA: Diagnosis not present

## 2019-02-23 DIAGNOSIS — E781 Pure hyperglyceridemia: Secondary | ICD-10-CM | POA: Diagnosis not present

## 2019-02-28 ENCOUNTER — Inpatient Hospital Stay (HOSPITAL_COMMUNITY): Payer: Medicare Other | Attending: Hematology | Admitting: Hematology

## 2019-02-28 ENCOUNTER — Encounter (HOSPITAL_COMMUNITY): Payer: Self-pay | Admitting: Hematology

## 2019-02-28 ENCOUNTER — Inpatient Hospital Stay (HOSPITAL_COMMUNITY): Payer: Medicare Other

## 2019-02-28 ENCOUNTER — Other Ambulatory Visit: Payer: Self-pay

## 2019-02-28 VITALS — HR 85 | Temp 97.9°F | Resp 16 | Wt 206.2 lb

## 2019-02-28 DIAGNOSIS — Z7982 Long term (current) use of aspirin: Secondary | ICD-10-CM | POA: Diagnosis not present

## 2019-02-28 DIAGNOSIS — E785 Hyperlipidemia, unspecified: Secondary | ICD-10-CM

## 2019-02-28 DIAGNOSIS — F419 Anxiety disorder, unspecified: Secondary | ICD-10-CM | POA: Diagnosis not present

## 2019-02-28 DIAGNOSIS — Z7951 Long term (current) use of inhaled steroids: Secondary | ICD-10-CM | POA: Insufficient documentation

## 2019-02-28 DIAGNOSIS — F329 Major depressive disorder, single episode, unspecified: Secondary | ICD-10-CM | POA: Diagnosis not present

## 2019-02-28 DIAGNOSIS — Z79899 Other long term (current) drug therapy: Secondary | ICD-10-CM | POA: Insufficient documentation

## 2019-02-28 DIAGNOSIS — D72829 Elevated white blood cell count, unspecified: Secondary | ICD-10-CM | POA: Insufficient documentation

## 2019-02-28 DIAGNOSIS — R2241 Localized swelling, mass and lump, right lower limb: Secondary | ICD-10-CM | POA: Insufficient documentation

## 2019-02-28 DIAGNOSIS — F17210 Nicotine dependence, cigarettes, uncomplicated: Secondary | ICD-10-CM | POA: Diagnosis not present

## 2019-02-28 DIAGNOSIS — M1990 Unspecified osteoarthritis, unspecified site: Secondary | ICD-10-CM | POA: Insufficient documentation

## 2019-02-28 DIAGNOSIS — J449 Chronic obstructive pulmonary disease, unspecified: Secondary | ICD-10-CM

## 2019-02-28 DIAGNOSIS — I10 Essential (primary) hypertension: Secondary | ICD-10-CM | POA: Diagnosis not present

## 2019-02-28 DIAGNOSIS — D729 Disorder of white blood cells, unspecified: Secondary | ICD-10-CM

## 2019-02-28 DIAGNOSIS — E7800 Pure hypercholesterolemia, unspecified: Secondary | ICD-10-CM | POA: Diagnosis not present

## 2019-02-28 DIAGNOSIS — Z8249 Family history of ischemic heart disease and other diseases of the circulatory system: Secondary | ICD-10-CM | POA: Diagnosis not present

## 2019-02-28 LAB — CBC WITH DIFFERENTIAL/PLATELET
Abs Immature Granulocytes: 0.02 10*3/uL (ref 0.00–0.07)
Basophils Absolute: 0.1 10*3/uL (ref 0.0–0.1)
Basophils Relative: 2 %
Eosinophils Absolute: 0.1 10*3/uL (ref 0.0–0.5)
Eosinophils Relative: 2 %
HCT: 40.9 % (ref 39.0–52.0)
Hemoglobin: 13 g/dL (ref 13.0–17.0)
Immature Granulocytes: 0 %
Lymphocytes Relative: 24 %
Lymphs Abs: 2.1 10*3/uL (ref 0.7–4.0)
MCH: 27.7 pg (ref 26.0–34.0)
MCHC: 31.8 g/dL (ref 30.0–36.0)
MCV: 87 fL (ref 80.0–100.0)
Monocytes Absolute: 0.6 10*3/uL (ref 0.1–1.0)
Monocytes Relative: 6 %
Neutro Abs: 5.8 10*3/uL (ref 1.7–7.7)
Neutrophils Relative %: 66 %
Platelets: 402 10*3/uL — ABNORMAL HIGH (ref 150–400)
RBC: 4.7 MIL/uL (ref 4.22–5.81)
RDW: 14.9 % (ref 11.5–15.5)
WBC: 8.7 10*3/uL (ref 4.0–10.5)
nRBC: 0 % (ref 0.0–0.2)

## 2019-02-28 LAB — COMPREHENSIVE METABOLIC PANEL
ALT: 18 U/L (ref 0–44)
AST: 16 U/L (ref 15–41)
Albumin: 3.8 g/dL (ref 3.5–5.0)
Alkaline Phosphatase: 74 U/L (ref 38–126)
Anion gap: 9 (ref 5–15)
BUN: 9 mg/dL (ref 6–20)
CO2: 27 mmol/L (ref 22–32)
Calcium: 9.4 mg/dL (ref 8.9–10.3)
Chloride: 102 mmol/L (ref 98–111)
Creatinine, Ser: 0.97 mg/dL (ref 0.61–1.24)
GFR calc Af Amer: >60 mL/min (ref >60)
GFR calc non Af Amer: >60 mL/min (ref >60)
Glucose, Bld: 105 mg/dL — ABNORMAL HIGH (ref 70–99)
Potassium: 4.1 mmol/L (ref 3.5–5.1)
Sodium: 138 mmol/L (ref 135–145)
Total Bilirubin: 0.5 mg/dL (ref 0.3–1.2)
Total Protein: 6.8 g/dL (ref 6.5–8.1)

## 2019-02-28 LAB — SEDIMENTATION RATE: Sed Rate: 9 mm/hr (ref 0–16)

## 2019-02-28 LAB — LACTATE DEHYDROGENASE: LDH: 127 U/L (ref 98–192)

## 2019-02-28 NOTE — Progress Notes (Signed)
[facility] Initial Visit:  Patient Care Team: Celene Squibb, MD as PCP - General (Internal Medicine)  CHIEF COMPLAINTS/PURPOSE OF CONSULTATION: - Leukocytosis     HISTORY OF PRESENTING ILLNESS: Farley Ly 48 y.o. male presents today for consult regarding elevated white blood cell count.  He has a past medical history significant for hypertension, COPD, depression, coronary artery disease, hyperlipidemia, tobacco abuse, chronic pain, and anemia.  During routine lab work with his PCP in December 2020 patient was noted to have an elevated white blood cell count of 23.1 and absolute neutrophil count of 20,000, monocytes were also noted to be elevated at 1.7, hemoglobin was 11.9.  He states he has never been told that he had an elevated white blood cell count before.  He denies any infection during this time or prednisone use.  He states the only new medication that he has been on in the past year was Celebrex and this was discontinued at his last office visit with his primary care provider.  He is a current everyday smoker.  Smoking about 5 cigarettes/day.  He states at 1 point he was smoking 3 packs of cigarettes a day this lasted approximately 7 years.  He states he started smoking at the age of 67.  The patient has been on disability for the last 5 years due to chronic pain.  He was a Nature conservation officer prior.  He also admits to smoking a joint of marijuana daily.  He uses alcohol occasionally.  He reports significant joint pain.  He states he has been told he has arthritis.  He also reports a mass in his right upper thigh that he states has been present for the last several years.  He denies any lymphadenopathy.  Denies any fevers, chills, night sweats.  No abnormal weight loss.  No change in bowel habits.  Appetite is stable.  He has a family history significant for prostate cancer in his paternal uncle, brain cancer in paternal aunt, and unknown cancer in his paternal  grandmother.    Review of Systems  Constitutional: Negative.   HENT:  Negative.   Eyes: Negative.   Respiratory: Negative.   Cardiovascular: Negative.   Gastrointestinal: Negative.   Endocrine: Negative.   Genitourinary: Negative.    Musculoskeletal: Positive for arthralgias, back pain and myalgias.  Skin: Negative.   Neurological: Negative.   Hematological: Negative.   Psychiatric/Behavioral: Negative.     MEDICAL HISTORY: Past Medical History:  Diagnosis Date  . Anxiety   . Arthritis   . COPD (chronic obstructive pulmonary disease) ([place])   . Depression   . Hypercholesteremia   . Hypertension     SURGICAL HISTORY: Past Surgical History:  Procedure Laterality Date  . BACK SURGERY    . LITHOTRIPSY      SOCIAL HISTORY: Social History   Socioeconomic History  . Marital status: Married    Spouse name: Not on file  . Number of children: Not on file  . Years of education: Not on file  . Highest education level: Not on file  Occupational History  . Not on file  Tobacco Use  . Smoking status: Current Every Day Smoker    Packs/day: 1.50    Types: Cigarettes  . Smokeless tobacco: Never Used  Substance and Sexual Activity  . Alcohol use: Yes    Comment: occ.  . Drug use: No  . Sexual activity: Not on file  Other Topics Concern  . Not on file  Social History  Narrative  . Not on file   Social Determinants of Health   Financial Resource Strain:   . Difficulty of Paying Living Expenses: Not on file  Food Insecurity:   . Worried About Charity fundraiser in the Last Year: Not on file  . Ran Out of Food in the Last Year: Not on file  Transportation Needs:   . Lack of Transportation (Medical): Not on file  . Lack of Transportation (Non-Medical): Not on file  Physical Activity:   . Days of Exercise per Week: Not on file  . Minutes of Exercise per Session: Not on file  Stress:   . Feeling of Stress : Not on file  Social Connections:   . Frequency of  Communication with Friends and Family: Not on file  . Frequency of Social Gatherings with Friends and Family: Not on file  . Attends Religious Services: Not on file  . Active Member of Clubs or Organizations: Not on file  . Attends Archivist Meetings: Not on file  . Marital Status: Not on file  Intimate Partner Violence:   . Fear of Current or Ex-Partner: Not on file  . Emotionally Abused: Not on file  . Physically Abused: Not on file  . Sexually Abused: Not on file    FAMILY HISTORY Family History  Problem Relation Age of Onset  . Anemia Mother   . Hypertension Father   . Cancer Paternal Aunt   . Brain cancer Paternal Aunt   . Cancer Paternal Uncle   . Cancer Paternal Grandmother     ALLERGIES:  is allergic to sulfa antibiotics.  MEDICATIONS:  Current Outpatient Medications  Medication Sig Dispense Refill  . ALPRAZolam (XANAX) 0.5 MG tablet Take 0.5 mg by mouth 2 (two) times daily.    Marland Kitchen amLODipine (NORVASC) 5 MG tablet Take 5 mg by mouth at bedtime.    Marland Kitchen aspirin EC 81 MG tablet Take 81 mg by mouth daily.    Marland Kitchen atorvastatin (LIPITOR) 20 MG tablet Take 20 mg by mouth at bedtime.    Marland Kitchen BREO ELLIPTA 200-25 MCG/INH AEPB     . buPROPion (WELLBUTRIN XL) 300 MG 24 hr tablet Take 300 mg by mouth daily.    Marland Kitchen EDARBI 80 MG TABS Take 80 mg by mouth daily.    Marland Kitchen FLUoxetine (PROZAC) 40 MG capsule Take 40 mg by mouth 2 (two) times daily.    . fluticasone (FLONASE) 50 MCG/ACT nasal spray Place 2 sprays into both nostrils daily.    Marland Kitchen gabapentin (NEURONTIN) 800 MG tablet Take 800 mg by mouth 3 (three) times daily.    . Iron-Folic Acid-Vit 123456 (IRON FORMULA PO) Take by mouth.    . naphazoline-glycerin (CLEAR EYES) 0.012-0.2 % SOLN Place 1-2 drops into both eyes every 4 (four) hours as needed for irritation.    Marland Kitchen omega-3 acid ethyl esters (LOVAZA) 1 g capsule Take 2 capsules by mouth 2 (two) times daily.    Marland Kitchen oxyCODONE-acetaminophen (PERCOCET) 10-325 MG tablet     . PROAIR HFA 108 (90  BASE) MCG/ACT inhaler Inhale 2 puffs into the lungs every 4 (four) hours as needed.    Marland Kitchen tiZANidine (ZANAFLEX) 4 MG tablet Take 4 mg by mouth every 8 (eight) hours as needed.  2   No current facility-administered medications for this visit.    PHYSICAL EXAMINATION:  ECOG PERFORMANCE STATUS: 1 - Symptomatic but completely ambulatory   Vitals:   02/28/19 1100  Pulse: 85  Resp: 16  Temp:  97.9 F (36.6 C)  SpO2: 99%    Filed Weights   02/28/19 1100  Weight: 206 lb 4 oz (93.6 kg)     Physical Exam Constitutional:      Appearance: Normal appearance. He is obese.  HENT:     Head: Normocephalic.     Right Ear: External ear normal.     Left Ear: External ear normal.     Nose: Nose normal.     Mouth/Throat:     Pharynx: Oropharynx is clear.  Eyes:     Conjunctiva/sclera: Conjunctivae normal.  Cardiovascular:     Rate and Rhythm: Normal rate and regular rhythm.     Pulses: Normal pulses.     Heart sounds: Normal heart sounds.  Pulmonary:     Effort: Pulmonary effort is normal.     Breath sounds: Normal breath sounds.  Abdominal:     General: Bowel sounds are normal.  Musculoskeletal:     Cervical back: Normal range of motion.     Comments: Decreased range of motion  Skin:    General: Skin is warm.  Neurological:     General: No focal deficit present.     Mental Status: He is alert and oriented to person, place, and time.  Psychiatric:        Mood and Affect: Mood normal.        Behavior: Behavior normal.        Thought Content: Thought content normal.        Judgment: Judgment normal.      LABORATORY DATA: I have personally reviewed the data as listed:  Office Visit on 02/28/2019  Component Date Value Ref Range Status  . WBC 02/28/2019 8.7  4.0 - 10.5 K/uL Final  . RBC 02/28/2019 4.70  4.22 - 5.81 MIL/uL Final  . Hemoglobin 02/28/2019 13.0  13.0 - 17.0 g/dL Final  . HCT 02/28/2019 40.9  39.0 - 52.0 % Final  . MCV 02/28/2019 87.0  80.0 - 100.0 fL Final  .  MCH 02/28/2019 27.7  26.0 - 34.0 pg Final  . MCHC 02/28/2019 31.8  30.0 - 36.0 g/dL Final  . RDW 02/28/2019 14.9  11.5 - 15.5 % Final  . Platelets 02/28/2019 402* 150 - 400 K/uL Final  . nRBC 02/28/2019 0.0  0.0 - 0.2 % Final  . Neutrophils Relative % 02/28/2019 66  % Final  . Neutro Abs 02/28/2019 5.8  1.7 - 7.7 K/uL Final  . Lymphocytes Relative 02/28/2019 24  % Final  . Lymphs Abs 02/28/2019 2.1  0.7 - 4.0 K/uL Final  . Monocytes Relative 02/28/2019 6  % Final  . Monocytes Absolute 02/28/2019 0.6  0.1 - 1.0 K/uL Final  . Eosinophils Relative 02/28/2019 2  % Final  . Eosinophils Absolute 02/28/2019 0.1  0.0 - 0.5 K/uL Final  . Basophils Relative 02/28/2019 2  % Final  . Basophils Absolute 02/28/2019 0.1  0.0 - 0.1 K/uL Final  . Immature Granulocytes 02/28/2019 0  % Final  . Abs Immature Granulocytes 02/28/2019 0.02  0.00 - 0.07 K/uL Final   Performed at [facility], [address] [place], [place] [address]  . Sodium 02/28/2019 138  135 - 145 mmol/L Final  . Potassium 02/28/2019 4.1  3.5 - 5.1 mmol/L Final  . Chloride 02/28/2019 102  98 - 111 mmol/L Final  . CO2 02/28/2019 27  22 - 32 mmol/L Final  . Glucose, Bld 02/28/2019 105* 70 - 99 mg/dL Final  . BUN 02/28/2019 9  6 - 20 mg/dL Final  . Creatinine, Ser 02/28/2019 0.97  0.61 - 1.24 mg/dL Final  . Calcium 02/28/2019 9.4  8.9 - 10.3 mg/dL Final  . Total Protein 02/28/2019 6.8  6.5 - 8.1 g/dL Final  . Albumin 02/28/2019 3.8  3.5 - 5.0 g/dL Final  . AST 02/28/2019 16  15 - 41 U/L Final  . ALT 02/28/2019 18  0 - 44 U/L Final  . Alkaline Phosphatase 02/28/2019 74  38 - 126 U/L Final  . Total Bilirubin 02/28/2019 0.5  0.3 - 1.2 mg/dL Final  . GFR calc non Af Amer 02/28/2019 >60  >60 mL/min Final  . GFR calc Af Amer 02/28/2019 >60  >60 mL/min Final  . Anion gap 02/28/2019 9  5 - 15 Final   Performed at [facility], [address], [place], [place] [address]  . LDH 02/28/2019 127  98 - 192 U/L Final   Performed at [facility], [address] [place],  [address]     ASSESSMENT/PLAN   Neutrophilia 1.  Neutrophilia : -Originally noted in routine follow-up with PCP on February 11, 2019.  BC revealed white blood cell count of 23.1, hemoglobin 11.9, neutrophils 9 0, monocytes 1.7. -No history of recent infection or prednisone use. -Patient is a current everyday smoker. -Discussed differential diagnosis of elevated white blood cell count to infection, formation, medications, asplenia, smoking, stress, obesity, endocrine disorders, or myeloproliferative disorders.  Patient has several factors that could contribute to an elevated white blood cell count.  We will need to lab work Etiology. -Also of note, elevated medications this is concerning for possible endocrine disorder involving the parathyroid.  We will check a PTH today. -We will follow-up with patient 2 to 3 weeks via phone visit.  2.  Right upper thigh mass -Patient's states mass has been there for several years.  We will order MRI today to further evaluate.  3.  Tobacco abuse -Patient has been a smoker since the age of 48 years old.  At one time he smoked 3 packs of cigarettes a day for approximately 7 years.  He currently smokes 5 cigarettes a day.  He is actively trying to quit smoking with the aid of Wellbutrin. -Patient will qualify for low-dose CT lung screening at the age of 56.   Orders Placed This Encounter  Procedures  . MR FEMUR RIGHT W WO CONTRAST    Standing Status:   Future    Standing Expiration Date:   04/30/2020    Order Specific Question:   ** REASON FOR EXAM (FREE TEXT)    Answer:   right thigh mass    Order Specific Question:   If indicated for the ordered procedure, I authorize the administration of contrast media per Radiology protocol    Answer:   Yes    Order Specific Question:   What is the patient's sedation requirement?    Answer:   No Sedation    Order Specific Question:   Does the patient have a pacemaker or  implanted devices?    Answer:   No    Order Specific Question:   Radiology Contrast Protocol - do NOT remove file path    Answer:   [email or web address]    Order Specific Question:   Preferred imaging location?    Answer:   [facility] (table limit-350lbs)  . Pathologist smear review  . CBC with Differential  . Comprehensive metabolic panel  . Lactate dehydrogenase  . BCR-ABL1, CML/ALL, PCR, QUANT  .  ANA, IFA (with reflex)  . Sedimentation rate  . PTH, intact and calcium  Addendum: -I have independently examined the patient and elicited history.  I agree with the HPI and assessment plan which was written by my nurse practitioner Carver Fila, FNP.  He had elevated white count, predominantly neutrophils and monocytes.  Does not use systemic steroids.  However uses nasal steroids spray as well as steroid inhaler.  Also a smoker.  Physical exam did not reveal any adenopathy or splenomegaly.  However there is a right upper to mid thigh mass, more prominent on leg extension.  He reported having this mass for 45 years.  Denies any pain although minor soreness.  Could be intramuscular lipoma.  We will obtain MRI with and without contrast.  We will also do further work-up for his leukocytosis with ruling out myeloproliferative disorders.  We will also work-up mild hypercalcemia.  All questions were answered. The patient knows to call the clinic with any problems, questions or concerns.  This note was electronically signed.    Roger Shelter, FNP  02/28/2019 1:47 PM

## 2019-02-28 NOTE — Assessment & Plan Note (Signed)
1.  Neutrophilia : -Originally noted in routine follow-up with PCP on February 11, 2019.  BC revealed white blood cell count of 23.1, hemoglobin 11.9, neutrophils 9 0, monocytes 1.7. -No history of recent infection or prednisone use. -Patient is a current everyday smoker. -Discussed differential diagnosis of elevated white blood cell count to infection, formation, medications, asplenia, smoking, stress, obesity, endocrine disorders, or myeloproliferative disorders.  Patient has several factors that could contribute to an elevated white blood cell count.  We will need to lab work Etiology. -Also of note, elevated medications this is concerning for possible endocrine disorder involving the parathyroid.  We will check a PTH today. -We will follow-up with patient 2 to 3 weeks via phone visit.  2.  Right upper thigh mass -Patient's states mass has been there for several years.  We will order MRI today to further evaluate.  3.  Tobacco abuse -Patient has been a smoker since the age of 48 years old.  At one time he smoked 3 packs of cigarettes a day for approximately 7 years.  He currently smokes 5 cigarettes a day.  He is actively trying to quit smoking with the aid of Wellbutrin. -Patient will qualify for low-dose CT lung screening at the age of 52.

## 2019-03-01 LAB — PTH, INTACT AND CALCIUM
Calcium, Total (PTH): 9.7 mg/dL (ref 8.7–10.2)
PTH: 30 pg/mL (ref 15–65)

## 2019-03-01 LAB — PATHOLOGIST SMEAR REVIEW

## 2019-03-02 LAB — ANTINUCLEAR ANTIBODIES, IFA: ANA Ab, IFA: NEGATIVE

## 2019-03-07 LAB — BCR-ABL1, CML/ALL, PCR, QUANT: Interpretation (BCRAL):: NEGATIVE

## 2019-03-08 DIAGNOSIS — Z87891 Personal history of nicotine dependence: Secondary | ICD-10-CM | POA: Diagnosis not present

## 2019-03-08 DIAGNOSIS — M5441 Lumbago with sciatica, right side: Secondary | ICD-10-CM | POA: Diagnosis not present

## 2019-03-08 DIAGNOSIS — Z79899 Other long term (current) drug therapy: Secondary | ICD-10-CM | POA: Diagnosis not present

## 2019-03-08 DIAGNOSIS — Z1159 Encounter for screening for other viral diseases: Secondary | ICD-10-CM | POA: Diagnosis not present

## 2019-03-08 DIAGNOSIS — G8929 Other chronic pain: Secondary | ICD-10-CM | POA: Diagnosis not present

## 2019-03-14 ENCOUNTER — Ambulatory Visit (HOSPITAL_COMMUNITY)
Admission: RE | Admit: 2019-03-14 | Discharge: 2019-03-14 | Disposition: A | Discharge status: Home / Self Care | Payer: Medicare Other | Source: Ambulatory Visit | Attending: Hematology | Admitting: Hematology

## 2019-03-14 ENCOUNTER — Other Ambulatory Visit: Payer: Self-pay

## 2019-03-14 DIAGNOSIS — R2241 Localized swelling, mass and lump, right lower limb: Secondary | ICD-10-CM | POA: Diagnosis not present

## 2019-03-14 MED ORDER — GADOBUTROL 1 MMOL/ML IV SOLN
10.0000 mL | Freq: Once | INTRAVENOUS | Status: AC | PRN
  Administered 2019-03-14: 10 mL via INTRAVENOUS

## 2019-03-16 ENCOUNTER — Inpatient Hospital Stay (HOSPITAL_COMMUNITY): Payer: Medicare Other | Attending: Hematology | Admitting: Hematology

## 2019-03-16 ENCOUNTER — Encounter (HOSPITAL_COMMUNITY): Payer: Self-pay | Admitting: Hematology

## 2019-03-16 DIAGNOSIS — D729 Disorder of white blood cells, unspecified: Secondary | ICD-10-CM | POA: Diagnosis not present

## 2019-03-16 DIAGNOSIS — R229 Localized swelling, mass and lump, unspecified: Secondary | ICD-10-CM | POA: Diagnosis not present

## 2019-03-16 NOTE — Progress Notes (Signed)
Virtual Visit via Telephone Note  I connected with Alan Miller on 03/16/19 at  4:05 PM EST by telephone and verified that I am speaking with the correct person using two identifiers.   I discussed the limitations, risks, security and privacy concerns of performing an evaluation and management service by telephone and the availability of in person appointments. I also discussed with the patient that there may be a patient responsible charge related to this service. The patient expressed understanding and agreed to proceed.   History of Present Illness: This patient was evaluated for neutrophilia, right upper thigh mass on 02/28/2019.  Blood work from his PCPs office from 02/11/2019 revealed a white count of 23.1 hemoglobin 11.9 and neutrophil count 9000 with monocytosis of 1.7.  No prior history of infection or prednisone use in the preceding 3 months.  Patient is a current everyday smoker.   Observations/Objective: He reports that he is feeling fine.  Denies any fevers, night sweats or weight loss.  Denies any recent infections.  Does not use any systemic steroids.  However uses nasal steroid spray as well as steroid inhaler.  Assessment and Plan:  1.  Resolved neutrophilic leukocytosis: -Repeat blood work on 02/28/2019 shows white count 8.7 with normal differential.  However platelet count is slightly elevated at 402.  We will follow up on it at next visit. -LDH was normal.  ANA was negative.  BCR/ABL by quantitative PCR is negative. -She will be seen back in 4 months with repeat his CBC with differential.  2.  Right thigh mass: -Lesion in the right rectus femoris on MRI of the femur shows 3.3 x 3.3 x 8.9 cm lesion consistent with simple lipoma. -He does not have any pain from it.  No further work-up is indicated.  3.  Mild hypercalcemia: -He had mild hypercalcemia from his doctor's office.  Repeat calcium was normal.  PTH was also normal.  No further work-up indicated.    Follow Up  Instructions: RTC 4 months with labs.   I discussed the assessment and treatment plan with the patient. The patient was provided an opportunity to ask questions and all were answered. The patient agreed with the plan and demonstrated an understanding of the instructions.   The patient was advised to call back or seek an in-person evaluation if the symptoms worsen or if the condition fails to improve as anticipated.  I provided 11 minutes of non-face-to-face time during this encounter.   Derek Jack, MD

## 2019-04-05 DIAGNOSIS — R944 Abnormal results of kidney function studies: Secondary | ICD-10-CM | POA: Diagnosis not present

## 2019-04-05 DIAGNOSIS — E782 Mixed hyperlipidemia: Secondary | ICD-10-CM | POA: Diagnosis not present

## 2019-04-05 DIAGNOSIS — I10 Essential (primary) hypertension: Secondary | ICD-10-CM | POA: Diagnosis not present

## 2019-04-12 DIAGNOSIS — G8929 Other chronic pain: Secondary | ICD-10-CM | POA: Diagnosis not present

## 2019-04-12 DIAGNOSIS — Z87891 Personal history of nicotine dependence: Secondary | ICD-10-CM | POA: Diagnosis not present

## 2019-04-12 DIAGNOSIS — M5441 Lumbago with sciatica, right side: Secondary | ICD-10-CM | POA: Diagnosis not present

## 2019-04-12 DIAGNOSIS — Z20822 Contact with and (suspected) exposure to covid-19: Secondary | ICD-10-CM | POA: Diagnosis not present

## 2019-04-12 DIAGNOSIS — F17210 Nicotine dependence, cigarettes, uncomplicated: Secondary | ICD-10-CM | POA: Diagnosis not present

## 2019-04-12 DIAGNOSIS — Z79899 Other long term (current) drug therapy: Secondary | ICD-10-CM | POA: Diagnosis not present

## 2019-04-25 DIAGNOSIS — I10 Essential (primary) hypertension: Secondary | ICD-10-CM | POA: Diagnosis not present

## 2019-04-25 DIAGNOSIS — E782 Mixed hyperlipidemia: Secondary | ICD-10-CM | POA: Diagnosis not present

## 2019-07-02 IMAGING — DX DG FINGER THUMB 2+V*L*
3 series · 3 of 3 positions shown · non-contrast
Comparison: None.

CLINICAL DATA: Patient cut left thumb on a knife while cutting
fishing line. No active bleeding.

EXAM:
LEFT THUMB 2+V

[finger ap]
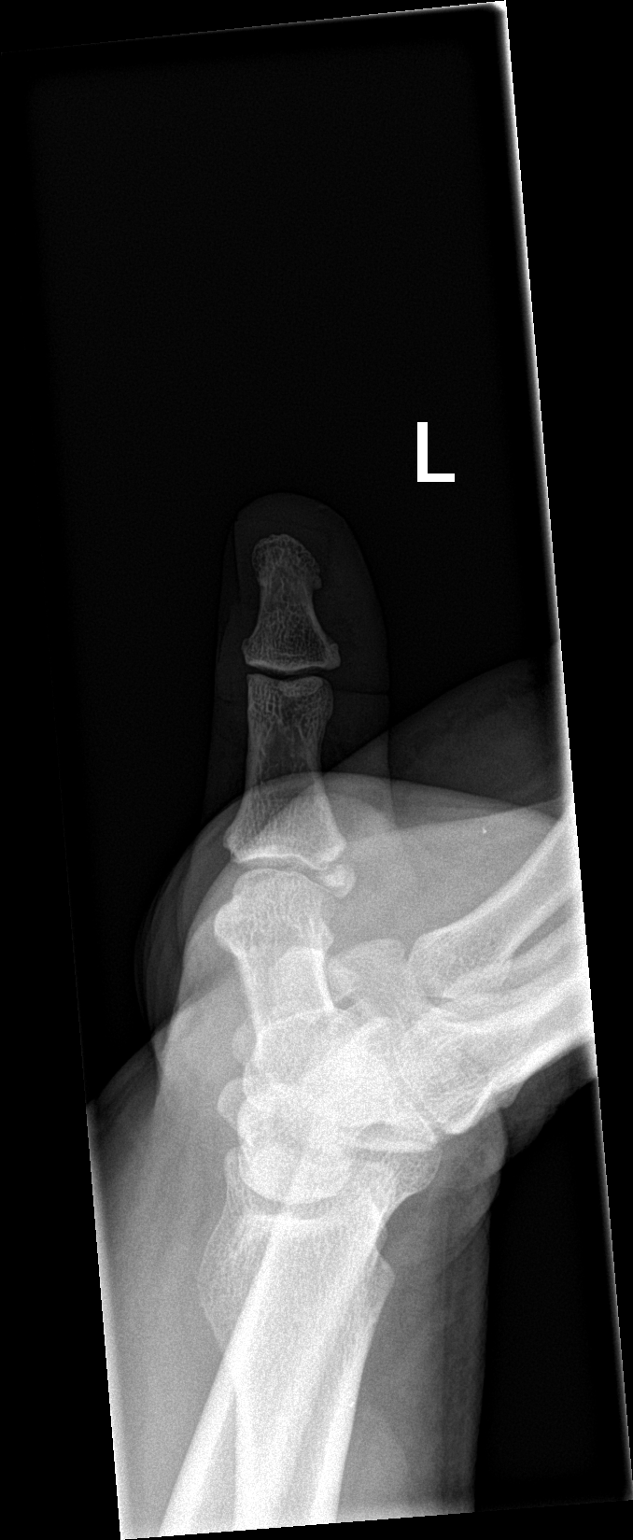

[finger obl]
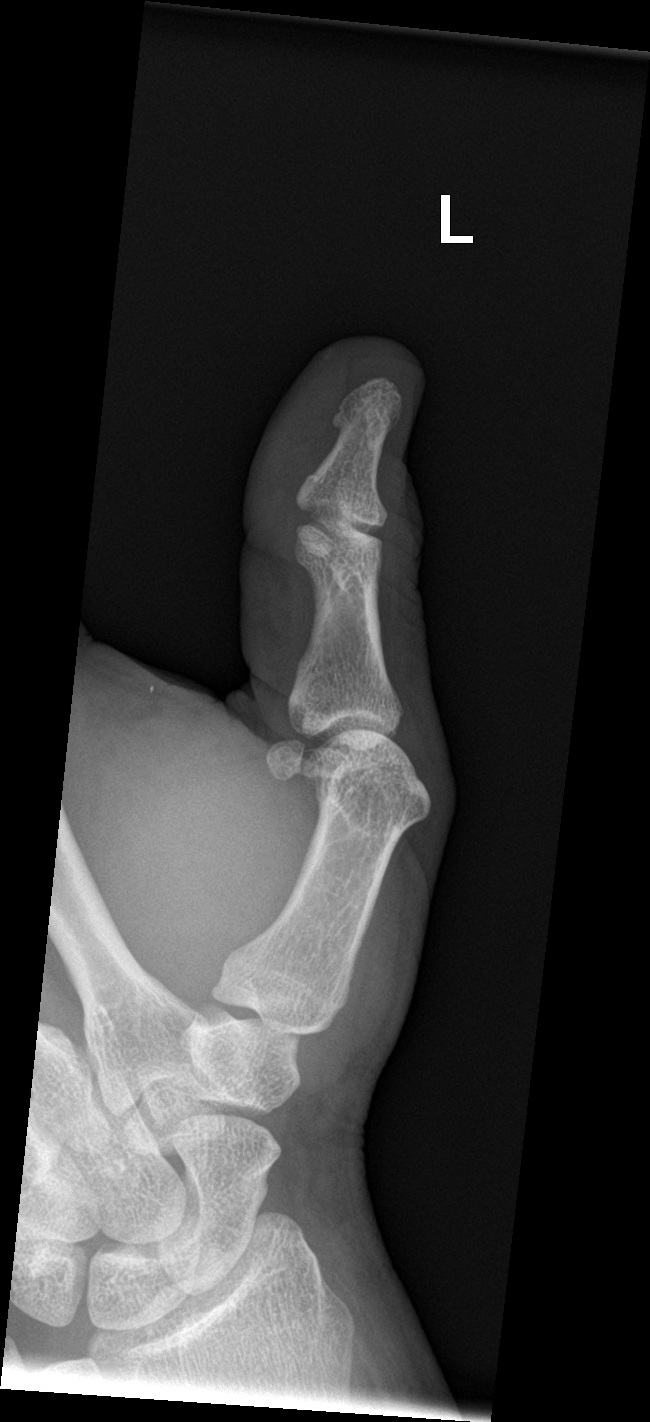

[finger lat]
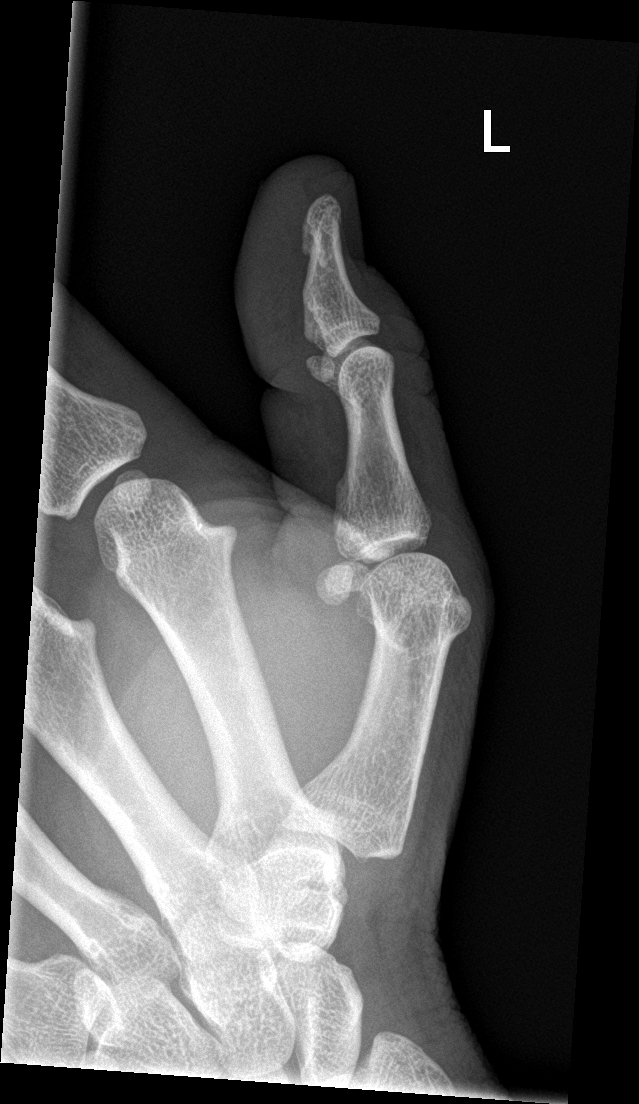

[3 of 3 positions shown; findings below may reference images not displayed]

FINDINGS: There is no evidence of acute fracture or dislocation. Minimal
surface debris seen at the tip of the thumb. Laceration is not
apparent radiographically possibly due to its size. No appreciable
subcutaneous emphysema. Joint spaces are maintained. There is no
evidence of arthropathy or other focal bone abnormality.
IMPRESSION: Minimal surface debris at the tip of the thumb. No underlying acute
osseous abnormality or radiopaque foreign body noted.

## 2019-07-07 DIAGNOSIS — I10 Essential (primary) hypertension: Secondary | ICD-10-CM | POA: Diagnosis not present

## 2019-07-07 DIAGNOSIS — R944 Abnormal results of kidney function studies: Secondary | ICD-10-CM | POA: Diagnosis not present

## 2019-07-07 DIAGNOSIS — E782 Mixed hyperlipidemia: Secondary | ICD-10-CM | POA: Diagnosis not present

## 2019-07-11 DIAGNOSIS — Z23 Encounter for immunization: Secondary | ICD-10-CM | POA: Diagnosis not present

## 2019-07-15 ENCOUNTER — Ambulatory Visit (HOSPITAL_COMMUNITY): Payer: Medicare Other | Admitting: Nurse Practitioner

## 2019-07-15 ENCOUNTER — Inpatient Hospital Stay (HOSPITAL_COMMUNITY): Payer: Medicare Other | Attending: Hematology

## 2019-07-20 DIAGNOSIS — R944 Abnormal results of kidney function studies: Secondary | ICD-10-CM | POA: Diagnosis not present

## 2019-07-20 DIAGNOSIS — E782 Mixed hyperlipidemia: Secondary | ICD-10-CM | POA: Diagnosis not present

## 2019-07-20 DIAGNOSIS — I10 Essential (primary) hypertension: Secondary | ICD-10-CM | POA: Diagnosis not present

## 2019-08-09 DIAGNOSIS — D649 Anemia, unspecified: Secondary | ICD-10-CM | POA: Diagnosis not present

## 2019-08-09 DIAGNOSIS — E781 Pure hyperglyceridemia: Secondary | ICD-10-CM | POA: Diagnosis not present

## 2019-08-09 DIAGNOSIS — E782 Mixed hyperlipidemia: Secondary | ICD-10-CM | POA: Diagnosis not present

## 2019-08-09 DIAGNOSIS — E7849 Other hyperlipidemia: Secondary | ICD-10-CM | POA: Diagnosis not present

## 2019-08-09 DIAGNOSIS — R7301 Impaired fasting glucose: Secondary | ICD-10-CM | POA: Diagnosis not present

## 2019-08-09 DIAGNOSIS — D72829 Elevated white blood cell count, unspecified: Secondary | ICD-10-CM | POA: Diagnosis not present

## 2019-08-11 DIAGNOSIS — Z0001 Encounter for general adult medical examination with abnormal findings: Secondary | ICD-10-CM | POA: Diagnosis not present

## 2019-08-11 DIAGNOSIS — I10 Essential (primary) hypertension: Secondary | ICD-10-CM | POA: Diagnosis not present

## 2019-08-11 DIAGNOSIS — E782 Mixed hyperlipidemia: Secondary | ICD-10-CM | POA: Diagnosis not present

## 2019-10-11 DIAGNOSIS — D509 Iron deficiency anemia, unspecified: Secondary | ICD-10-CM | POA: Diagnosis not present

## 2019-10-11 DIAGNOSIS — E782 Mixed hyperlipidemia: Secondary | ICD-10-CM | POA: Diagnosis not present

## 2019-10-11 DIAGNOSIS — Z0001 Encounter for general adult medical examination with abnormal findings: Secondary | ICD-10-CM | POA: Diagnosis not present

## 2019-10-11 DIAGNOSIS — I10 Essential (primary) hypertension: Secondary | ICD-10-CM | POA: Diagnosis not present

## 2019-11-10 DIAGNOSIS — Z634 Disappearance and death of family member: Secondary | ICD-10-CM | POA: Diagnosis not present

## 2019-11-10 DIAGNOSIS — I10 Essential (primary) hypertension: Secondary | ICD-10-CM | POA: Diagnosis not present

## 2019-11-29 DIAGNOSIS — N202 Calculus of kidney with calculus of ureter: Secondary | ICD-10-CM | POA: Diagnosis not present

## 2019-11-29 DIAGNOSIS — M545 Low back pain: Secondary | ICD-10-CM | POA: Diagnosis not present

## 2019-11-29 DIAGNOSIS — R109 Unspecified abdominal pain: Secondary | ICD-10-CM | POA: Diagnosis not present

## 2019-12-06 DIAGNOSIS — I10 Essential (primary) hypertension: Secondary | ICD-10-CM | POA: Diagnosis not present

## 2019-12-06 DIAGNOSIS — Z634 Disappearance and death of family member: Secondary | ICD-10-CM | POA: Diagnosis not present

## 2020-01-06 DIAGNOSIS — I10 Essential (primary) hypertension: Secondary | ICD-10-CM | POA: Diagnosis not present

## 2020-01-12 DIAGNOSIS — I10 Essential (primary) hypertension: Secondary | ICD-10-CM | POA: Diagnosis not present

## 2020-01-12 DIAGNOSIS — Z634 Disappearance and death of family member: Secondary | ICD-10-CM | POA: Diagnosis not present

## 2020-02-06 DIAGNOSIS — I10 Essential (primary) hypertension: Secondary | ICD-10-CM | POA: Diagnosis not present

## 2020-02-06 DIAGNOSIS — R944 Abnormal results of kidney function studies: Secondary | ICD-10-CM | POA: Diagnosis not present

## 2020-02-06 DIAGNOSIS — E782 Mixed hyperlipidemia: Secondary | ICD-10-CM | POA: Diagnosis not present

## 2020-03-09 DIAGNOSIS — E782 Mixed hyperlipidemia: Secondary | ICD-10-CM | POA: Diagnosis not present

## 2020-03-09 DIAGNOSIS — I10 Essential (primary) hypertension: Secondary | ICD-10-CM | POA: Diagnosis not present

## 2020-04-07 DIAGNOSIS — G8929 Other chronic pain: Secondary | ICD-10-CM | POA: Diagnosis not present

## 2020-04-07 DIAGNOSIS — G9009 Other idiopathic peripheral autonomic neuropathy: Secondary | ICD-10-CM | POA: Diagnosis not present

## 2020-04-07 DIAGNOSIS — I10 Essential (primary) hypertension: Secondary | ICD-10-CM | POA: Diagnosis not present

## 2020-04-07 DIAGNOSIS — D72829 Elevated white blood cell count, unspecified: Secondary | ICD-10-CM | POA: Diagnosis not present

## 2020-04-07 DIAGNOSIS — E782 Mixed hyperlipidemia: Secondary | ICD-10-CM | POA: Diagnosis not present

## 2020-04-07 DIAGNOSIS — F17200 Nicotine dependence, unspecified, uncomplicated: Secondary | ICD-10-CM | POA: Diagnosis not present

## 2020-04-16 DIAGNOSIS — E781 Pure hyperglyceridemia: Secondary | ICD-10-CM | POA: Diagnosis not present

## 2020-04-16 DIAGNOSIS — Z0001 Encounter for general adult medical examination with abnormal findings: Secondary | ICD-10-CM | POA: Diagnosis not present

## 2020-04-16 DIAGNOSIS — D649 Anemia, unspecified: Secondary | ICD-10-CM | POA: Diagnosis not present

## 2020-04-16 DIAGNOSIS — G894 Chronic pain syndrome: Secondary | ICD-10-CM | POA: Diagnosis not present

## 2020-04-16 DIAGNOSIS — I10 Essential (primary) hypertension: Secondary | ICD-10-CM | POA: Diagnosis not present

## 2020-04-16 DIAGNOSIS — D72829 Elevated white blood cell count, unspecified: Secondary | ICD-10-CM | POA: Diagnosis not present

## 2020-04-16 DIAGNOSIS — M1990 Unspecified osteoarthritis, unspecified site: Secondary | ICD-10-CM | POA: Diagnosis not present

## 2020-04-19 DIAGNOSIS — D72829 Elevated white blood cell count, unspecified: Secondary | ICD-10-CM | POA: Diagnosis not present

## 2020-04-19 DIAGNOSIS — G9009 Other idiopathic peripheral autonomic neuropathy: Secondary | ICD-10-CM | POA: Diagnosis not present

## 2020-04-19 DIAGNOSIS — K148 Other diseases of tongue: Secondary | ICD-10-CM | POA: Diagnosis not present

## 2020-04-19 DIAGNOSIS — R944 Abnormal results of kidney function studies: Secondary | ICD-10-CM | POA: Diagnosis not present

## 2020-04-19 DIAGNOSIS — K5900 Constipation, unspecified: Secondary | ICD-10-CM | POA: Diagnosis not present

## 2020-04-19 DIAGNOSIS — F17200 Nicotine dependence, unspecified, uncomplicated: Secondary | ICD-10-CM | POA: Diagnosis not present

## 2020-04-19 DIAGNOSIS — G8929 Other chronic pain: Secondary | ICD-10-CM | POA: Diagnosis not present

## 2020-04-19 DIAGNOSIS — E782 Mixed hyperlipidemia: Secondary | ICD-10-CM | POA: Diagnosis not present

## 2020-04-19 DIAGNOSIS — R0602 Shortness of breath: Secondary | ICD-10-CM | POA: Diagnosis not present

## 2020-04-19 DIAGNOSIS — I10 Essential (primary) hypertension: Secondary | ICD-10-CM | POA: Diagnosis not present

## 2020-05-07 DIAGNOSIS — G9009 Other idiopathic peripheral autonomic neuropathy: Secondary | ICD-10-CM | POA: Diagnosis not present

## 2020-05-07 DIAGNOSIS — G8929 Other chronic pain: Secondary | ICD-10-CM | POA: Diagnosis not present

## 2020-05-07 DIAGNOSIS — F17200 Nicotine dependence, unspecified, uncomplicated: Secondary | ICD-10-CM | POA: Diagnosis not present

## 2020-05-07 DIAGNOSIS — D72829 Elevated white blood cell count, unspecified: Secondary | ICD-10-CM | POA: Diagnosis not present

## 2020-05-07 DIAGNOSIS — E782 Mixed hyperlipidemia: Secondary | ICD-10-CM | POA: Diagnosis not present

## 2020-05-07 DIAGNOSIS — I10 Essential (primary) hypertension: Secondary | ICD-10-CM | POA: Diagnosis not present

## 2020-05-17 DIAGNOSIS — G9009 Other idiopathic peripheral autonomic neuropathy: Secondary | ICD-10-CM | POA: Diagnosis not present

## 2020-05-17 DIAGNOSIS — R0602 Shortness of breath: Secondary | ICD-10-CM | POA: Diagnosis not present

## 2020-05-17 DIAGNOSIS — F17200 Nicotine dependence, unspecified, uncomplicated: Secondary | ICD-10-CM | POA: Diagnosis not present

## 2020-05-17 DIAGNOSIS — G8929 Other chronic pain: Secondary | ICD-10-CM | POA: Diagnosis not present

## 2020-05-17 DIAGNOSIS — D72829 Elevated white blood cell count, unspecified: Secondary | ICD-10-CM | POA: Diagnosis not present

## 2020-05-17 DIAGNOSIS — I10 Essential (primary) hypertension: Secondary | ICD-10-CM | POA: Diagnosis not present

## 2020-05-17 DIAGNOSIS — K148 Other diseases of tongue: Secondary | ICD-10-CM | POA: Diagnosis not present

## 2020-05-17 DIAGNOSIS — K5900 Constipation, unspecified: Secondary | ICD-10-CM | POA: Diagnosis not present

## 2020-05-17 DIAGNOSIS — E782 Mixed hyperlipidemia: Secondary | ICD-10-CM | POA: Diagnosis not present

## 2020-06-06 DIAGNOSIS — R944 Abnormal results of kidney function studies: Secondary | ICD-10-CM | POA: Diagnosis not present

## 2020-06-06 DIAGNOSIS — I10 Essential (primary) hypertension: Secondary | ICD-10-CM | POA: Diagnosis not present

## 2020-06-06 DIAGNOSIS — D72829 Elevated white blood cell count, unspecified: Secondary | ICD-10-CM | POA: Diagnosis not present

## 2020-06-06 DIAGNOSIS — E782 Mixed hyperlipidemia: Secondary | ICD-10-CM | POA: Diagnosis not present

## 2020-06-06 DIAGNOSIS — G8929 Other chronic pain: Secondary | ICD-10-CM | POA: Diagnosis not present

## 2020-06-06 DIAGNOSIS — F17200 Nicotine dependence, unspecified, uncomplicated: Secondary | ICD-10-CM | POA: Diagnosis not present

## 2020-06-06 DIAGNOSIS — K5900 Constipation, unspecified: Secondary | ICD-10-CM | POA: Diagnosis not present

## 2020-06-06 DIAGNOSIS — K148 Other diseases of tongue: Secondary | ICD-10-CM | POA: Diagnosis not present

## 2020-06-06 DIAGNOSIS — G9009 Other idiopathic peripheral autonomic neuropathy: Secondary | ICD-10-CM | POA: Diagnosis not present

## 2020-06-06 DIAGNOSIS — R0602 Shortness of breath: Secondary | ICD-10-CM | POA: Diagnosis not present

## 2020-07-08 DIAGNOSIS — M1990 Unspecified osteoarthritis, unspecified site: Secondary | ICD-10-CM | POA: Diagnosis not present

## 2020-07-23 ENCOUNTER — Ambulatory Visit (HOSPITAL_COMMUNITY)
Admission: RE | Admit: 2020-07-23 | Discharge: 2020-07-23 | Disposition: A | Discharge status: Home / Self Care | Payer: Medicare Other | Source: Ambulatory Visit | Attending: Family Medicine | Admitting: Family Medicine

## 2020-07-23 ENCOUNTER — Other Ambulatory Visit (HOSPITAL_COMMUNITY): Payer: Self-pay | Admitting: Family Medicine

## 2020-07-23 DIAGNOSIS — R0609 Other forms of dyspnea: Secondary | ICD-10-CM | POA: Diagnosis not present

## 2020-07-23 DIAGNOSIS — R609 Edema, unspecified: Secondary | ICD-10-CM | POA: Diagnosis not present

## 2020-07-23 DIAGNOSIS — J439 Emphysema, unspecified: Secondary | ICD-10-CM | POA: Diagnosis not present

## 2020-08-22 DIAGNOSIS — I10 Essential (primary) hypertension: Secondary | ICD-10-CM | POA: Diagnosis not present

## 2020-08-22 DIAGNOSIS — J449 Chronic obstructive pulmonary disease, unspecified: Secondary | ICD-10-CM | POA: Diagnosis not present

## 2020-08-22 DIAGNOSIS — E782 Mixed hyperlipidemia: Secondary | ICD-10-CM | POA: Diagnosis not present

## 2020-11-07 DIAGNOSIS — M1990 Unspecified osteoarthritis, unspecified site: Secondary | ICD-10-CM | POA: Diagnosis not present

## 2020-11-23 DIAGNOSIS — I10 Essential (primary) hypertension: Secondary | ICD-10-CM | POA: Diagnosis not present

## 2020-11-23 DIAGNOSIS — R7301 Impaired fasting glucose: Secondary | ICD-10-CM | POA: Diagnosis not present

## 2020-11-27 DIAGNOSIS — J449 Chronic obstructive pulmonary disease, unspecified: Secondary | ICD-10-CM | POA: Diagnosis not present

## 2020-11-27 DIAGNOSIS — R7301 Impaired fasting glucose: Secondary | ICD-10-CM | POA: Diagnosis not present

## 2020-11-27 DIAGNOSIS — Z0001 Encounter for general adult medical examination with abnormal findings: Secondary | ICD-10-CM | POA: Diagnosis not present

## 2020-11-27 DIAGNOSIS — I10 Essential (primary) hypertension: Secondary | ICD-10-CM | POA: Diagnosis not present

## 2020-11-27 DIAGNOSIS — D72829 Elevated white blood cell count, unspecified: Secondary | ICD-10-CM | POA: Diagnosis not present

## 2020-11-27 DIAGNOSIS — R944 Abnormal results of kidney function studies: Secondary | ICD-10-CM | POA: Diagnosis not present

## 2020-11-27 DIAGNOSIS — D509 Iron deficiency anemia, unspecified: Secondary | ICD-10-CM | POA: Diagnosis not present

## 2020-11-27 DIAGNOSIS — Z23 Encounter for immunization: Secondary | ICD-10-CM | POA: Diagnosis not present

## 2020-11-27 DIAGNOSIS — E782 Mixed hyperlipidemia: Secondary | ICD-10-CM | POA: Diagnosis not present

## 2020-12-07 DIAGNOSIS — M1990 Unspecified osteoarthritis, unspecified site: Secondary | ICD-10-CM | POA: Diagnosis not present

## 2021-01-07 DIAGNOSIS — I10 Essential (primary) hypertension: Secondary | ICD-10-CM | POA: Diagnosis not present

## 2021-01-07 DIAGNOSIS — E785 Hyperlipidemia, unspecified: Secondary | ICD-10-CM | POA: Diagnosis not present

## 2021-02-06 DIAGNOSIS — I10 Essential (primary) hypertension: Secondary | ICD-10-CM | POA: Diagnosis not present

## 2021-02-06 DIAGNOSIS — E785 Hyperlipidemia, unspecified: Secondary | ICD-10-CM | POA: Diagnosis not present

## 2021-02-26 DIAGNOSIS — D229 Melanocytic nevi, unspecified: Secondary | ICD-10-CM | POA: Diagnosis not present

## 2021-02-26 DIAGNOSIS — M25511 Pain in right shoulder: Secondary | ICD-10-CM | POA: Diagnosis not present

## 2021-04-09 DIAGNOSIS — E785 Hyperlipidemia, unspecified: Secondary | ICD-10-CM | POA: Diagnosis not present

## 2021-04-09 DIAGNOSIS — I10 Essential (primary) hypertension: Secondary | ICD-10-CM | POA: Diagnosis not present

## 2021-05-23 DIAGNOSIS — R7301 Impaired fasting glucose: Secondary | ICD-10-CM | POA: Diagnosis not present

## 2021-05-23 DIAGNOSIS — E782 Mixed hyperlipidemia: Secondary | ICD-10-CM | POA: Diagnosis not present

## 2021-05-23 DIAGNOSIS — D509 Iron deficiency anemia, unspecified: Secondary | ICD-10-CM | POA: Diagnosis not present

## 2021-05-28 DIAGNOSIS — D509 Iron deficiency anemia, unspecified: Secondary | ICD-10-CM | POA: Diagnosis not present

## 2021-05-28 DIAGNOSIS — I10 Essential (primary) hypertension: Secondary | ICD-10-CM | POA: Diagnosis not present

## 2021-05-28 DIAGNOSIS — F17200 Nicotine dependence, unspecified, uncomplicated: Secondary | ICD-10-CM | POA: Diagnosis not present

## 2021-05-28 DIAGNOSIS — R944 Abnormal results of kidney function studies: Secondary | ICD-10-CM | POA: Diagnosis not present

## 2021-05-28 DIAGNOSIS — R7301 Impaired fasting glucose: Secondary | ICD-10-CM | POA: Diagnosis not present

## 2021-05-28 DIAGNOSIS — E782 Mixed hyperlipidemia: Secondary | ICD-10-CM | POA: Diagnosis not present

## 2021-05-28 DIAGNOSIS — D72829 Elevated white blood cell count, unspecified: Secondary | ICD-10-CM | POA: Diagnosis not present

## 2021-05-28 DIAGNOSIS — Z634 Disappearance and death of family member: Secondary | ICD-10-CM | POA: Diagnosis not present

## 2021-05-28 DIAGNOSIS — J449 Chronic obstructive pulmonary disease, unspecified: Secondary | ICD-10-CM | POA: Diagnosis not present

## 2021-07-07 DIAGNOSIS — E782 Mixed hyperlipidemia: Secondary | ICD-10-CM | POA: Diagnosis not present

## 2021-07-07 DIAGNOSIS — M1990 Unspecified osteoarthritis, unspecified site: Secondary | ICD-10-CM | POA: Diagnosis not present

## 2021-07-07 DIAGNOSIS — I10 Essential (primary) hypertension: Secondary | ICD-10-CM | POA: Diagnosis not present

## 2021-07-07 DIAGNOSIS — J449 Chronic obstructive pulmonary disease, unspecified: Secondary | ICD-10-CM | POA: Diagnosis not present

## 2021-08-07 DIAGNOSIS — M1990 Unspecified osteoarthritis, unspecified site: Secondary | ICD-10-CM | POA: Diagnosis not present

## 2021-08-07 DIAGNOSIS — J449 Chronic obstructive pulmonary disease, unspecified: Secondary | ICD-10-CM | POA: Diagnosis not present

## 2021-08-07 DIAGNOSIS — E782 Mixed hyperlipidemia: Secondary | ICD-10-CM | POA: Diagnosis not present

## 2021-08-07 DIAGNOSIS — I10 Essential (primary) hypertension: Secondary | ICD-10-CM | POA: Diagnosis not present

## 2021-11-21 DIAGNOSIS — R7301 Impaired fasting glucose: Secondary | ICD-10-CM | POA: Diagnosis not present

## 2021-11-21 DIAGNOSIS — Z136 Encounter for screening for cardiovascular disorders: Secondary | ICD-10-CM | POA: Diagnosis not present

## 2021-11-21 DIAGNOSIS — D509 Iron deficiency anemia, unspecified: Secondary | ICD-10-CM | POA: Diagnosis not present

## 2021-11-28 DIAGNOSIS — D72829 Elevated white blood cell count, unspecified: Secondary | ICD-10-CM | POA: Diagnosis not present

## 2021-11-28 DIAGNOSIS — R7303 Prediabetes: Secondary | ICD-10-CM | POA: Diagnosis not present

## 2021-11-28 DIAGNOSIS — E782 Mixed hyperlipidemia: Secondary | ICD-10-CM | POA: Diagnosis not present

## 2021-11-28 DIAGNOSIS — J449 Chronic obstructive pulmonary disease, unspecified: Secondary | ICD-10-CM | POA: Diagnosis not present

## 2021-11-28 DIAGNOSIS — D509 Iron deficiency anemia, unspecified: Secondary | ICD-10-CM | POA: Diagnosis not present

## 2021-11-28 DIAGNOSIS — Z23 Encounter for immunization: Secondary | ICD-10-CM | POA: Diagnosis not present

## 2021-11-28 DIAGNOSIS — I10 Essential (primary) hypertension: Secondary | ICD-10-CM | POA: Diagnosis not present

## 2021-11-28 DIAGNOSIS — R944 Abnormal results of kidney function studies: Secondary | ICD-10-CM | POA: Diagnosis not present

## 2021-11-28 DIAGNOSIS — Z0001 Encounter for general adult medical examination with abnormal findings: Secondary | ICD-10-CM | POA: Diagnosis not present

## 2022-02-12 DIAGNOSIS — D1730 Benign lipomatous neoplasm of skin and subcutaneous tissue of unspecified sites: Secondary | ICD-10-CM | POA: Diagnosis not present

## 2022-02-12 DIAGNOSIS — Z1211 Encounter for screening for malignant neoplasm of colon: Secondary | ICD-10-CM | POA: Diagnosis not present

## 2022-02-12 DIAGNOSIS — D72829 Elevated white blood cell count, unspecified: Secondary | ICD-10-CM | POA: Diagnosis not present

## 2022-02-12 DIAGNOSIS — J449 Chronic obstructive pulmonary disease, unspecified: Secondary | ICD-10-CM | POA: Diagnosis not present

## 2022-02-12 DIAGNOSIS — F17200 Nicotine dependence, unspecified, uncomplicated: Secondary | ICD-10-CM | POA: Diagnosis not present

## 2022-02-12 DIAGNOSIS — I10 Essential (primary) hypertension: Secondary | ICD-10-CM | POA: Diagnosis not present

## 2022-02-12 DIAGNOSIS — L989 Disorder of the skin and subcutaneous tissue, unspecified: Secondary | ICD-10-CM | POA: Diagnosis not present

## 2022-02-12 DIAGNOSIS — R944 Abnormal results of kidney function studies: Secondary | ICD-10-CM | POA: Diagnosis not present

## 2022-02-12 DIAGNOSIS — D509 Iron deficiency anemia, unspecified: Secondary | ICD-10-CM | POA: Diagnosis not present

## 2022-02-18 ENCOUNTER — Encounter: Payer: Self-pay | Admitting: *Deleted

## 2022-03-25 DIAGNOSIS — L910 Hypertrophic scar: Secondary | ICD-10-CM | POA: Diagnosis not present

## 2022-03-25 DIAGNOSIS — D2372 Other benign neoplasm of skin of left lower limb, including hip: Secondary | ICD-10-CM | POA: Diagnosis not present

## 2022-03-25 DIAGNOSIS — Z1283 Encounter for screening for malignant neoplasm of skin: Secondary | ICD-10-CM | POA: Diagnosis not present

## 2022-03-25 DIAGNOSIS — D2371 Other benign neoplasm of skin of right lower limb, including hip: Secondary | ICD-10-CM | POA: Diagnosis not present

## 2022-03-25 DIAGNOSIS — D225 Melanocytic nevi of trunk: Secondary | ICD-10-CM | POA: Diagnosis not present

## 2022-05-23 DIAGNOSIS — R7303 Prediabetes: Secondary | ICD-10-CM | POA: Diagnosis not present

## 2022-05-23 DIAGNOSIS — D509 Iron deficiency anemia, unspecified: Secondary | ICD-10-CM | POA: Diagnosis not present

## 2022-05-29 DIAGNOSIS — R944 Abnormal results of kidney function studies: Secondary | ICD-10-CM | POA: Diagnosis not present

## 2022-05-29 DIAGNOSIS — I10 Essential (primary) hypertension: Secondary | ICD-10-CM | POA: Diagnosis not present

## 2022-05-29 DIAGNOSIS — J449 Chronic obstructive pulmonary disease, unspecified: Secondary | ICD-10-CM | POA: Diagnosis not present

## 2022-05-29 DIAGNOSIS — Z0001 Encounter for general adult medical examination with abnormal findings: Secondary | ICD-10-CM | POA: Diagnosis not present

## 2022-05-29 DIAGNOSIS — D1730 Benign lipomatous neoplasm of skin and subcutaneous tissue of unspecified sites: Secondary | ICD-10-CM | POA: Diagnosis not present

## 2022-05-29 DIAGNOSIS — D72829 Elevated white blood cell count, unspecified: Secondary | ICD-10-CM | POA: Diagnosis not present

## 2022-05-29 DIAGNOSIS — D509 Iron deficiency anemia, unspecified: Secondary | ICD-10-CM | POA: Diagnosis not present

## 2022-05-29 DIAGNOSIS — F17210 Nicotine dependence, cigarettes, uncomplicated: Secondary | ICD-10-CM | POA: Diagnosis not present

## 2022-05-29 DIAGNOSIS — L989 Disorder of the skin and subcutaneous tissue, unspecified: Secondary | ICD-10-CM | POA: Diagnosis not present

## 2022-08-11 ENCOUNTER — Encounter: Payer: Self-pay | Admitting: *Deleted

## 2022-11-25 DIAGNOSIS — R944 Abnormal results of kidney function studies: Secondary | ICD-10-CM | POA: Diagnosis not present

## 2022-11-25 DIAGNOSIS — D509 Iron deficiency anemia, unspecified: Secondary | ICD-10-CM | POA: Diagnosis not present

## 2022-11-25 DIAGNOSIS — R7303 Prediabetes: Secondary | ICD-10-CM | POA: Diagnosis not present

## 2022-12-02 DIAGNOSIS — R944 Abnormal results of kidney function studies: Secondary | ICD-10-CM | POA: Diagnosis not present

## 2022-12-02 DIAGNOSIS — D509 Iron deficiency anemia, unspecified: Secondary | ICD-10-CM | POA: Diagnosis not present

## 2022-12-02 DIAGNOSIS — F17200 Nicotine dependence, unspecified, uncomplicated: Secondary | ICD-10-CM | POA: Diagnosis not present

## 2022-12-02 DIAGNOSIS — D1730 Benign lipomatous neoplasm of skin and subcutaneous tissue of unspecified sites: Secondary | ICD-10-CM | POA: Diagnosis not present

## 2022-12-02 DIAGNOSIS — Z23 Encounter for immunization: Secondary | ICD-10-CM | POA: Diagnosis not present

## 2022-12-02 DIAGNOSIS — J449 Chronic obstructive pulmonary disease, unspecified: Secondary | ICD-10-CM | POA: Diagnosis not present

## 2022-12-02 DIAGNOSIS — L989 Disorder of the skin and subcutaneous tissue, unspecified: Secondary | ICD-10-CM | POA: Diagnosis not present

## 2022-12-02 DIAGNOSIS — D72829 Elevated white blood cell count, unspecified: Secondary | ICD-10-CM | POA: Diagnosis not present

## 2022-12-02 DIAGNOSIS — I10 Essential (primary) hypertension: Secondary | ICD-10-CM | POA: Diagnosis not present

## 2023-01-13 DIAGNOSIS — K219 Gastro-esophageal reflux disease without esophagitis: Secondary | ICD-10-CM | POA: Diagnosis not present

## 2023-01-13 DIAGNOSIS — I10 Essential (primary) hypertension: Secondary | ICD-10-CM | POA: Diagnosis not present

## 2023-01-13 DIAGNOSIS — J449 Chronic obstructive pulmonary disease, unspecified: Secondary | ICD-10-CM | POA: Diagnosis not present

## 2023-01-13 DIAGNOSIS — M1990 Unspecified osteoarthritis, unspecified site: Secondary | ICD-10-CM | POA: Diagnosis not present

## 2023-01-13 DIAGNOSIS — F17210 Nicotine dependence, cigarettes, uncomplicated: Secondary | ICD-10-CM | POA: Diagnosis not present

## 2023-01-13 DIAGNOSIS — E782 Mixed hyperlipidemia: Secondary | ICD-10-CM | POA: Diagnosis not present

## 2023-01-13 DIAGNOSIS — Z713 Dietary counseling and surveillance: Secondary | ICD-10-CM | POA: Diagnosis not present

## 2023-01-13 DIAGNOSIS — J302 Other seasonal allergic rhinitis: Secondary | ICD-10-CM | POA: Diagnosis not present

## 2023-01-13 DIAGNOSIS — Z760 Encounter for issue of repeat prescription: Secondary | ICD-10-CM | POA: Diagnosis not present

## 2023-01-13 DIAGNOSIS — M62830 Muscle spasm of back: Secondary | ICD-10-CM | POA: Diagnosis not present

## 2023-05-11 DIAGNOSIS — I10 Essential (primary) hypertension: Secondary | ICD-10-CM | POA: Diagnosis not present

## 2023-05-11 DIAGNOSIS — R7303 Prediabetes: Secondary | ICD-10-CM | POA: Diagnosis not present

## 2023-05-14 DIAGNOSIS — M62830 Muscle spasm of back: Secondary | ICD-10-CM | POA: Diagnosis not present

## 2023-05-14 DIAGNOSIS — E782 Mixed hyperlipidemia: Secondary | ICD-10-CM | POA: Diagnosis not present

## 2023-05-14 DIAGNOSIS — J302 Other seasonal allergic rhinitis: Secondary | ICD-10-CM | POA: Diagnosis not present

## 2023-05-14 DIAGNOSIS — D72829 Elevated white blood cell count, unspecified: Secondary | ICD-10-CM | POA: Diagnosis not present

## 2023-05-14 DIAGNOSIS — I10 Essential (primary) hypertension: Secondary | ICD-10-CM | POA: Diagnosis not present

## 2023-05-14 DIAGNOSIS — R944 Abnormal results of kidney function studies: Secondary | ICD-10-CM | POA: Diagnosis not present

## 2023-05-14 DIAGNOSIS — J449 Chronic obstructive pulmonary disease, unspecified: Secondary | ICD-10-CM | POA: Diagnosis not present

## 2023-05-14 DIAGNOSIS — M1990 Unspecified osteoarthritis, unspecified site: Secondary | ICD-10-CM | POA: Diagnosis not present

## 2023-05-14 DIAGNOSIS — K219 Gastro-esophageal reflux disease without esophagitis: Secondary | ICD-10-CM | POA: Diagnosis not present

## 2023-05-14 DIAGNOSIS — D509 Iron deficiency anemia, unspecified: Secondary | ICD-10-CM | POA: Diagnosis not present

## 2023-08-25 DIAGNOSIS — D509 Iron deficiency anemia, unspecified: Secondary | ICD-10-CM | POA: Diagnosis not present

## 2023-08-25 DIAGNOSIS — R7303 Prediabetes: Secondary | ICD-10-CM | POA: Diagnosis not present

## 2023-08-31 DIAGNOSIS — R944 Abnormal results of kidney function studies: Secondary | ICD-10-CM | POA: Diagnosis not present

## 2023-08-31 DIAGNOSIS — F17200 Nicotine dependence, unspecified, uncomplicated: Secondary | ICD-10-CM | POA: Diagnosis not present

## 2023-08-31 DIAGNOSIS — K219 Gastro-esophageal reflux disease without esophagitis: Secondary | ICD-10-CM | POA: Diagnosis not present

## 2023-08-31 DIAGNOSIS — J302 Other seasonal allergic rhinitis: Secondary | ICD-10-CM | POA: Diagnosis not present

## 2023-08-31 DIAGNOSIS — E782 Mixed hyperlipidemia: Secondary | ICD-10-CM | POA: Diagnosis not present

## 2023-08-31 DIAGNOSIS — M62830 Muscle spasm of back: Secondary | ICD-10-CM | POA: Diagnosis not present

## 2023-08-31 DIAGNOSIS — I10 Essential (primary) hypertension: Secondary | ICD-10-CM | POA: Diagnosis not present

## 2023-08-31 DIAGNOSIS — J449 Chronic obstructive pulmonary disease, unspecified: Secondary | ICD-10-CM | POA: Diagnosis not present

## 2023-08-31 DIAGNOSIS — D509 Iron deficiency anemia, unspecified: Secondary | ICD-10-CM | POA: Diagnosis not present

## 2023-08-31 DIAGNOSIS — M1990 Unspecified osteoarthritis, unspecified site: Secondary | ICD-10-CM | POA: Diagnosis not present

## 2023-10-05 DIAGNOSIS — G8929 Other chronic pain: Secondary | ICD-10-CM | POA: Diagnosis not present

## 2023-10-05 DIAGNOSIS — I10 Essential (primary) hypertension: Secondary | ICD-10-CM | POA: Diagnosis not present

## 2023-10-05 DIAGNOSIS — Z791 Long term (current) use of non-steroidal anti-inflammatories (NSAID): Secondary | ICD-10-CM | POA: Diagnosis not present

## 2023-10-05 DIAGNOSIS — F17200 Nicotine dependence, unspecified, uncomplicated: Secondary | ICD-10-CM | POA: Diagnosis not present

## 2023-10-05 DIAGNOSIS — Z713 Dietary counseling and surveillance: Secondary | ICD-10-CM | POA: Diagnosis not present

## 2023-10-05 DIAGNOSIS — M25551 Pain in right hip: Secondary | ICD-10-CM | POA: Diagnosis not present

## 2023-10-05 DIAGNOSIS — Z79899 Other long term (current) drug therapy: Secondary | ICD-10-CM | POA: Diagnosis not present
